# Patient Record
Sex: Female | Born: 1945 | Race: Black or African American | Hispanic: No | Smoking: Never smoker
Health system: Southern US, Community
[De-identification: ages and names within clinical notes are randomized; demographics above are authoritative.]

## PROBLEM LIST (undated history)

## (undated) DIAGNOSIS — N184 Chronic kidney disease, stage 4 (severe): Secondary | ICD-10-CM

## (undated) DIAGNOSIS — I639 Cerebral infarction, unspecified: Secondary | ICD-10-CM

## (undated) DIAGNOSIS — E119 Type 2 diabetes mellitus without complications: Secondary | ICD-10-CM

## (undated) DIAGNOSIS — I5030 Unspecified diastolic (congestive) heart failure: Secondary | ICD-10-CM

## (undated) DIAGNOSIS — I10 Essential (primary) hypertension: Secondary | ICD-10-CM

## (undated) DIAGNOSIS — J449 Chronic obstructive pulmonary disease, unspecified: Secondary | ICD-10-CM

## (undated) DIAGNOSIS — I480 Paroxysmal atrial fibrillation: Secondary | ICD-10-CM

## (undated) DIAGNOSIS — K922 Gastrointestinal hemorrhage, unspecified: Secondary | ICD-10-CM

## (undated) DIAGNOSIS — R931 Abnormal findings on diagnostic imaging of heart and coronary circulation: Secondary | ICD-10-CM

---

## 2017-11-24 DIAGNOSIS — J449 Chronic obstructive pulmonary disease, unspecified: Secondary | ICD-10-CM | POA: Diagnosis not present

## 2017-11-24 DIAGNOSIS — I5021 Acute systolic (congestive) heart failure: Secondary | ICD-10-CM

## 2017-11-24 DIAGNOSIS — I639 Cerebral infarction, unspecified: Secondary | ICD-10-CM | POA: Diagnosis not present

## 2017-11-24 DIAGNOSIS — I482 Chronic atrial fibrillation: Secondary | ICD-10-CM

## 2017-11-24 DIAGNOSIS — J9620 Acute and chronic respiratory failure, unspecified whether with hypoxia or hypercapnia: Secondary | ICD-10-CM | POA: Diagnosis not present

## 2017-11-25 DIAGNOSIS — I482 Chronic atrial fibrillation: Secondary | ICD-10-CM | POA: Diagnosis not present

## 2017-11-25 DIAGNOSIS — J9620 Acute and chronic respiratory failure, unspecified whether with hypoxia or hypercapnia: Secondary | ICD-10-CM | POA: Diagnosis not present

## 2017-11-25 DIAGNOSIS — J449 Chronic obstructive pulmonary disease, unspecified: Secondary | ICD-10-CM

## 2017-11-25 DIAGNOSIS — I639 Cerebral infarction, unspecified: Secondary | ICD-10-CM | POA: Diagnosis not present

## 2017-11-25 DIAGNOSIS — I5021 Acute systolic (congestive) heart failure: Secondary | ICD-10-CM | POA: Diagnosis not present

## 2017-11-30 ENCOUNTER — Encounter (HOSPITAL_COMMUNITY): Payer: Self-pay | Admitting: Nurse Practitioner

## 2017-11-30 ENCOUNTER — Inpatient Hospital Stay (HOSPITAL_COMMUNITY): Payer: Medicare Other

## 2017-11-30 ENCOUNTER — Inpatient Hospital Stay (HOSPITAL_COMMUNITY)
Admission: AD | Admit: 2017-11-30 | Discharge: 2017-12-20 | DRG: 004 | Disposition: A | Discharge status: Other Acute Inpatient Hospital | Payer: Medicare Other | Source: Other Acute Inpatient Hospital | Attending: Pulmonary Disease | Admitting: Pulmonary Disease

## 2017-11-30 DIAGNOSIS — J9621 Acute and chronic respiratory failure with hypoxia: Secondary | ICD-10-CM | POA: Diagnosis present

## 2017-11-30 DIAGNOSIS — R6521 Severe sepsis with septic shock: Secondary | ICD-10-CM | POA: Diagnosis present

## 2017-11-30 DIAGNOSIS — R1310 Dysphagia, unspecified: Secondary | ICD-10-CM | POA: Diagnosis present

## 2017-11-30 DIAGNOSIS — Z515 Encounter for palliative care: Secondary | ICD-10-CM

## 2017-11-30 DIAGNOSIS — J440 Chronic obstructive pulmonary disease with acute lower respiratory infection: Secondary | ICD-10-CM | POA: Diagnosis present

## 2017-11-30 DIAGNOSIS — J810 Acute pulmonary edema: Secondary | ICD-10-CM | POA: Diagnosis not present

## 2017-11-30 DIAGNOSIS — R791 Abnormal coagulation profile: Secondary | ICD-10-CM | POA: Diagnosis present

## 2017-11-30 DIAGNOSIS — N179 Acute kidney failure, unspecified: Secondary | ICD-10-CM | POA: Diagnosis present

## 2017-11-30 DIAGNOSIS — Z66 Do not resuscitate: Secondary | ICD-10-CM | POA: Diagnosis not present

## 2017-11-30 DIAGNOSIS — Z9289 Personal history of other medical treatment: Secondary | ICD-10-CM

## 2017-11-30 DIAGNOSIS — R197 Diarrhea, unspecified: Secondary | ICD-10-CM | POA: Diagnosis not present

## 2017-11-30 DIAGNOSIS — N184 Chronic kidney disease, stage 4 (severe): Secondary | ICD-10-CM | POA: Diagnosis not present

## 2017-11-30 DIAGNOSIS — J156 Pneumonia due to other aerobic Gram-negative bacteria: Secondary | ICD-10-CM | POA: Diagnosis present

## 2017-11-30 DIAGNOSIS — I5032 Chronic diastolic (congestive) heart failure: Secondary | ICD-10-CM | POA: Diagnosis present

## 2017-11-30 DIAGNOSIS — K5900 Constipation, unspecified: Secondary | ICD-10-CM | POA: Diagnosis not present

## 2017-11-30 DIAGNOSIS — I69391 Dysphagia following cerebral infarction: Secondary | ICD-10-CM

## 2017-11-30 DIAGNOSIS — I480 Paroxysmal atrial fibrillation: Secondary | ICD-10-CM | POA: Diagnosis present

## 2017-11-30 DIAGNOSIS — A4159 Other Gram-negative sepsis: Principal | ICD-10-CM | POA: Diagnosis present

## 2017-11-30 DIAGNOSIS — Y95 Nosocomial condition: Secondary | ICD-10-CM | POA: Diagnosis present

## 2017-11-30 DIAGNOSIS — G9341 Metabolic encephalopathy: Secondary | ICD-10-CM | POA: Diagnosis not present

## 2017-11-30 DIAGNOSIS — Z992 Dependence on renal dialysis: Secondary | ICD-10-CM

## 2017-11-30 DIAGNOSIS — Z930 Tracheostomy status: Secondary | ICD-10-CM

## 2017-11-30 DIAGNOSIS — D638 Anemia in other chronic diseases classified elsewhere: Secondary | ICD-10-CM | POA: Diagnosis present

## 2017-11-30 DIAGNOSIS — Z781 Physical restraint status: Secondary | ICD-10-CM

## 2017-11-30 DIAGNOSIS — I953 Hypotension of hemodialysis: Secondary | ICD-10-CM | POA: Diagnosis not present

## 2017-11-30 DIAGNOSIS — E1122 Type 2 diabetes mellitus with diabetic chronic kidney disease: Secondary | ICD-10-CM | POA: Diagnosis present

## 2017-11-30 DIAGNOSIS — E872 Acidosis: Secondary | ICD-10-CM | POA: Diagnosis present

## 2017-11-30 DIAGNOSIS — J439 Emphysema, unspecified: Secondary | ICD-10-CM | POA: Diagnosis not present

## 2017-11-30 DIAGNOSIS — I132 Hypertensive heart and chronic kidney disease with heart failure and with stage 5 chronic kidney disease, or end stage renal disease: Secondary | ICD-10-CM | POA: Diagnosis present

## 2017-11-30 DIAGNOSIS — J9601 Acute respiratory failure with hypoxia: Secondary | ICD-10-CM

## 2017-11-30 DIAGNOSIS — F411 Generalized anxiety disorder: Secondary | ICD-10-CM

## 2017-11-30 DIAGNOSIS — I9589 Other hypotension: Secondary | ICD-10-CM | POA: Diagnosis not present

## 2017-11-30 DIAGNOSIS — J189 Pneumonia, unspecified organism: Secondary | ICD-10-CM | POA: Diagnosis not present

## 2017-11-30 DIAGNOSIS — D631 Anemia in chronic kidney disease: Secondary | ICD-10-CM | POA: Diagnosis present

## 2017-11-30 DIAGNOSIS — I69354 Hemiplegia and hemiparesis following cerebral infarction affecting left non-dominant side: Secondary | ICD-10-CM | POA: Diagnosis not present

## 2017-11-30 DIAGNOSIS — I361 Nonrheumatic tricuspid (valve) insufficiency: Secondary | ICD-10-CM | POA: Diagnosis not present

## 2017-11-30 DIAGNOSIS — N186 End stage renal disease: Secondary | ICD-10-CM

## 2017-11-30 DIAGNOSIS — E785 Hyperlipidemia, unspecified: Secondary | ICD-10-CM | POA: Diagnosis not present

## 2017-11-30 DIAGNOSIS — Z9981 Dependence on supplemental oxygen: Secondary | ICD-10-CM

## 2017-11-30 DIAGNOSIS — D6489 Other specified anemias: Secondary | ICD-10-CM | POA: Diagnosis present

## 2017-11-30 DIAGNOSIS — L8990 Pressure ulcer of unspecified site, unspecified stage: Secondary | ICD-10-CM

## 2017-11-30 DIAGNOSIS — L89152 Pressure ulcer of sacral region, stage 2: Secondary | ICD-10-CM | POA: Diagnosis not present

## 2017-11-30 DIAGNOSIS — Z9911 Dependence on respirator [ventilator] status: Secondary | ICD-10-CM

## 2017-11-30 DIAGNOSIS — J9690 Respiratory failure, unspecified, unspecified whether with hypoxia or hypercapnia: Secondary | ICD-10-CM

## 2017-11-30 DIAGNOSIS — Z87891 Personal history of nicotine dependence: Secondary | ICD-10-CM

## 2017-11-30 DIAGNOSIS — A419 Sepsis, unspecified organism: Secondary | ICD-10-CM | POA: Diagnosis present

## 2017-11-30 DIAGNOSIS — Z931 Gastrostomy status: Secondary | ICD-10-CM

## 2017-11-30 DIAGNOSIS — I21A1 Myocardial infarction type 2: Secondary | ICD-10-CM | POA: Diagnosis not present

## 2017-11-30 DIAGNOSIS — J811 Chronic pulmonary edema: Secondary | ICD-10-CM | POA: Diagnosis present

## 2017-11-30 HISTORY — DX: Chronic kidney disease, stage 4 (severe): N18.4

## 2017-11-30 HISTORY — DX: Gastrointestinal hemorrhage, unspecified: K92.2

## 2017-11-30 HISTORY — DX: Cerebral infarction, unspecified: I63.9

## 2017-11-30 HISTORY — DX: Chronic obstructive pulmonary disease, unspecified: J44.9

## 2017-11-30 HISTORY — DX: Essential (primary) hypertension: I10

## 2017-11-30 HISTORY — DX: Type 2 diabetes mellitus without complications: E11.9

## 2017-11-30 HISTORY — DX: Unspecified diastolic (congestive) heart failure: I50.30

## 2017-11-30 HISTORY — DX: Abnormal findings on diagnostic imaging of heart and coronary circulation: R93.1

## 2017-11-30 HISTORY — DX: Paroxysmal atrial fibrillation: I48.0

## 2017-11-30 LAB — POCT I-STAT 3, ART BLOOD GAS (G3+)
Acid-Base Excess: 3 mmol/L — ABNORMAL HIGH (ref 0.0–2.0)
Bicarbonate: 29.5 mmol/L — ABNORMAL HIGH (ref 20.0–28.0)
O2 SAT: 86 %
PCO2 ART: 50.6 mmHg — AB (ref 32.0–48.0)
PO2 ART: 53 mmHg — AB (ref 83.0–108.0)
Patient temperature: 97.6
TCO2: 31 mmol/L (ref 22–32)
pH, Arterial: 7.372 (ref 7.350–7.450)

## 2017-11-30 LAB — PROCALCITONIN: PROCALCITONIN: 0.7 ng/mL

## 2017-11-30 LAB — CBC
HCT: 29.2 % — ABNORMAL LOW (ref 36.0–46.0)
Hemoglobin: 8.6 g/dL — ABNORMAL LOW (ref 12.0–15.0)
MCH: 28.5 pg (ref 26.0–34.0)
MCHC: 29.5 g/dL — ABNORMAL LOW (ref 30.0–36.0)
MCV: 96.7 fL (ref 78.0–100.0)
PLATELETS: 175 10*3/uL (ref 150–400)
RBC: 3.02 MIL/uL — ABNORMAL LOW (ref 3.87–5.11)
RDW: 19 % — AB (ref 11.5–15.5)
WBC: 12.1 10*3/uL — AB (ref 4.0–10.5)

## 2017-11-30 LAB — BASIC METABOLIC PANEL
Anion gap: 8 (ref 5–15)
BUN: 54 mg/dL — AB (ref 6–20)
CALCIUM: 9 mg/dL (ref 8.9–10.3)
CO2: 31 mmol/L (ref 22–32)
CREATININE: 2.45 mg/dL — AB (ref 0.44–1.00)
Chloride: 97 mmol/L — ABNORMAL LOW (ref 101–111)
GFR calc Af Amer: 22 mL/min — ABNORMAL LOW (ref >60)
GFR, EST NON AFRICAN AMERICAN: 19 mL/min — AB (ref >60)
Glucose, Bld: 136 mg/dL — ABNORMAL HIGH (ref 65–99)
Potassium: 4.5 mmol/L (ref 3.5–5.1)
SODIUM: 136 mmol/L (ref 135–145)

## 2017-11-30 LAB — GLUCOSE, CAPILLARY: Glucose-Capillary: 109 mg/dL — ABNORMAL HIGH (ref 65–99)

## 2017-11-30 LAB — LACTIC ACID, PLASMA: Lactic Acid, Venous: 1 mmol/L (ref 0.5–1.9)

## 2017-11-30 LAB — PROTIME-INR
INR: 2.45
PROTHROMBIN TIME: 26.4 s — AB (ref 11.4–15.2)

## 2017-11-30 LAB — MAGNESIUM: Magnesium: 2.1 mg/dL (ref 1.7–2.4)

## 2017-11-30 LAB — PHOSPHORUS: PHOSPHORUS: 1.9 mg/dL — AB (ref 2.5–4.6)

## 2017-11-30 LAB — TROPONIN I: Troponin I: 0.06 ng/mL (ref <0.03)

## 2017-11-30 LAB — MRSA PCR SCREENING: MRSA BY PCR: NEGATIVE

## 2017-11-30 MED ORDER — PANTOPRAZOLE SODIUM 40 MG PO PACK
40.0000 mg | PACK | Freq: Every day | ORAL | Status: DC
  Administered 2017-12-01 – 2017-12-14 (×14): 40 mg
  Filled 2017-11-30 (×13): qty 20

## 2017-11-30 MED ORDER — MIDAZOLAM HCL 2 MG/2ML IJ SOLN
1.0000 mg | INTRAMUSCULAR | Status: DC | PRN
  Administered 2017-12-03 – 2017-12-06 (×2): 1 mg via INTRAVENOUS
  Filled 2017-11-30 (×2): qty 2

## 2017-11-30 MED ORDER — PRISMASOL BGK 4/2.5 32-4-2.5 MEQ/L IV SOLN
INTRAVENOUS | Status: DC
  Administered 2017-11-30 – 2017-12-08 (×17): via INTRAVENOUS_CENTRAL
  Filled 2017-11-30 (×26): qty 5000

## 2017-11-30 MED ORDER — FENTANYL CITRATE (PF) 100 MCG/2ML IJ SOLN: 50.0000 ug | INTRAMUSCULAR | Status: DC | PRN

## 2017-11-30 MED ORDER — FENTANYL CITRATE (PF) 100 MCG/2ML IJ SOLN: 50.0000 ug | Freq: Once | INTRAMUSCULAR | Status: DC

## 2017-11-30 MED ORDER — FENTANYL BOLUS VIA INFUSION
25.0000 ug | INTRAVENOUS | Status: DC | PRN
  Administered 2017-12-01 – 2017-12-03 (×3): 25 ug via INTRAVENOUS
  Filled 2017-11-30: qty 25

## 2017-11-30 MED ORDER — IPRATROPIUM-ALBUTEROL 0.5-2.5 (3) MG/3ML IN SOLN
3.0000 mL | Freq: Four times a day (QID) | RESPIRATORY_TRACT | Status: DC
  Administered 2017-11-30 – 2017-12-07 (×27): 3 mL via RESPIRATORY_TRACT
  Filled 2017-11-30 (×24): qty 3

## 2017-11-30 MED ORDER — ATROPINE SULFATE 1 MG/10ML IJ SOSY
PREFILLED_SYRINGE | INTRAMUSCULAR | Status: AC
  Filled 2017-11-30: qty 10

## 2017-11-30 MED ORDER — HEPARIN SODIUM (PORCINE) 1000 UNIT/ML DIALYSIS
1000.0000 [IU] | INTRAMUSCULAR | Status: DC | PRN
  Administered 2017-12-06: 2200 [IU] via INTRAVENOUS_CENTRAL
  Administered 2017-12-08: 6000 [IU] via INTRAVENOUS_CENTRAL
  Filled 2017-11-30: qty 3
  Filled 2017-11-30 (×5): qty 6

## 2017-11-30 MED ORDER — SODIUM CHLORIDE 0.9 % IV SOLN
250.0000 mL | INTRAVENOUS | Status: DC | PRN
  Administered 2017-11-30: 500 mL via INTRAVENOUS
  Administered 2017-12-03 – 2017-12-17 (×7): 250 mL via INTRAVENOUS

## 2017-11-30 MED ORDER — PRISMASOL BGK 4/2.5 32-4-2.5 MEQ/L IV SOLN
INTRAVENOUS | Status: DC
Start: 2017-11-30 — End: 2017-12-11
  Administered 2017-11-30 – 2017-12-08 (×50): via INTRAVENOUS_CENTRAL
  Filled 2017-11-30 (×84): qty 5000

## 2017-11-30 MED ORDER — PRISMASOL BGK 4/2.5 32-4-2.5 MEQ/L IV SOLN
INTRAVENOUS | Status: DC
  Administered 2017-11-30 – 2017-12-07 (×6): via INTRAVENOUS_CENTRAL
  Filled 2017-11-30 (×11): qty 5000

## 2017-11-30 MED ORDER — MIDAZOLAM HCL 2 MG/2ML IJ SOLN
1.0000 mg | INTRAMUSCULAR | Status: DC | PRN
  Administered 2017-12-03 – 2017-12-15 (×19): 1 mg via INTRAVENOUS
  Filled 2017-11-30 (×20): qty 2

## 2017-11-30 MED ORDER — FENTANYL 2500MCG IN NS 250ML (10MCG/ML) PREMIX INFUSION
25.0000 ug/h | INTRAVENOUS | Status: DC
  Administered 2017-11-30: 100 ug/h via INTRAVENOUS
  Administered 2017-12-01: 150 ug/h via INTRAVENOUS
  Administered 2017-12-02 – 2017-12-03 (×3): 200 ug/h via INTRAVENOUS
  Administered 2017-12-03 – 2017-12-04 (×2): 250 ug/h via INTRAVENOUS
  Filled 2017-11-30 (×7): qty 250

## 2017-11-30 MED ORDER — HEPARIN (PORCINE) 2000 UNITS/L FOR CRRT
INTRAVENOUS_CENTRAL | Status: DC | PRN
  Administered 2017-11-30: via INTRAVENOUS_CENTRAL
  Filled 2017-11-30 (×2): qty 1000

## 2017-11-30 MED ORDER — HEPARIN SODIUM (PORCINE) 5000 UNIT/ML IJ SOLN: 5000.0000 [IU] | Freq: Three times a day (TID) | INTRAMUSCULAR | Status: DC

## 2017-11-30 MED ORDER — ORAL CARE MOUTH RINSE
15.0000 mL | OROMUCOSAL | Status: DC
  Administered 2017-12-01 – 2017-12-06 (×53): 15 mL via OROMUCOSAL

## 2017-11-30 MED ORDER — INSULIN ASPART 100 UNIT/ML ~~LOC~~ SOLN
2.0000 [IU] | SUBCUTANEOUS | Status: DC
  Administered 2017-12-01: 4 [IU] via SUBCUTANEOUS
  Administered 2017-12-01 – 2017-12-02 (×5): 2 [IU] via SUBCUTANEOUS
  Administered 2017-12-02: 4 [IU] via SUBCUTANEOUS
  Administered 2017-12-02: 2 [IU] via SUBCUTANEOUS
  Administered 2017-12-02: 4 [IU] via SUBCUTANEOUS
  Administered 2017-12-02: 2 [IU] via SUBCUTANEOUS
  Administered 2017-12-03 (×3): 4 [IU] via SUBCUTANEOUS
  Administered 2017-12-03: 2 [IU] via SUBCUTANEOUS
  Administered 2017-12-03 – 2017-12-04 (×2): 4 [IU] via SUBCUTANEOUS
  Administered 2017-12-04: 2 [IU] via SUBCUTANEOUS
  Administered 2017-12-04: 6 [IU] via SUBCUTANEOUS
  Administered 2017-12-04 (×2): 4 [IU] via SUBCUTANEOUS
  Administered 2017-12-05 (×2): 6 [IU] via SUBCUTANEOUS
  Administered 2017-12-05: 2 [IU] via SUBCUTANEOUS
  Administered 2017-12-05 – 2017-12-06 (×4): 4 [IU] via SUBCUTANEOUS
  Administered 2017-12-06: 2 [IU] via SUBCUTANEOUS
  Administered 2017-12-06 – 2017-12-07 (×7): 4 [IU] via SUBCUTANEOUS
  Administered 2017-12-07 – 2017-12-08 (×2): 6 [IU] via SUBCUTANEOUS
  Administered 2017-12-08: 2 [IU] via SUBCUTANEOUS
  Administered 2017-12-08: 4 [IU] via SUBCUTANEOUS
  Administered 2017-12-08: 6 [IU] via SUBCUTANEOUS
  Administered 2017-12-08 (×2): 4 [IU] via SUBCUTANEOUS
  Administered 2017-12-09 – 2017-12-10 (×9): 6 [IU] via SUBCUTANEOUS

## 2017-11-30 MED ORDER — CHLORHEXIDINE GLUCONATE 0.12% ORAL RINSE (MEDLINE KIT)
15.0000 mL | Freq: Two times a day (BID) | OROMUCOSAL | Status: DC
  Administered 2017-11-30 – 2017-12-05 (×11): 15 mL via OROMUCOSAL

## 2017-11-30 MED ORDER — NOREPINEPHRINE 4 MG/250ML-% IV SOLN
0.0000 ug/min | INTRAVENOUS | Status: DC
  Administered 2017-11-30: 9 ug/min via INTRAVENOUS
  Administered 2017-12-01: 25 ug/min via INTRAVENOUS
  Administered 2017-12-01: 18 ug/min via INTRAVENOUS
  Administered 2017-12-01: 25 ug/min via INTRAVENOUS
  Filled 2017-11-30 (×4): qty 250

## 2017-11-30 NOTE — H&P (Addendum)
PULMONARY / CRITICAL CARE MEDICINE   Name: Sheila Cabrera MRN: [id number] DOB: [birth year]    ADMISSION DATE:  11/30/2017 CONSULTATION DATE:  11/30/2017  [facility]ING MD:  [facility]   CHIEF COMPLAINT:  Respiratory Distress   HISTORY OF PRESENT ILLNESS:   72 year old female with PMH of DM, COPD, Diastolic HF, ESRD, CVA, Dysphagia s/p PEG  [facility] 5/6 > Presented with hypoxia/AMS secondary to PNA requiring intubation, transferred to [facility] 5/6. Due to GI bleed Eliquis was held, during this time developed acute bilateral cortical strokes. Previously had Stage 4 CKD however this progressed to ESRD with needs for HD. Extubated (unknown time frame), however due to inability to wean off BiPAP patient was transferred to [facility] on 5/31.    While at [facility] was intermittently off BiPAP for short periods. On 6/7 required intubation for respiratory distress. Due to inability to tolerate HD with bradycardia and hypotension patient was transferred to [facility].   PAST MEDICAL HISTORY :  She  has a past medical history of CHF (congestive heart failure) (HCC), Chronic kidney disease, COPD (chronic obstructive pulmonary disease) (HCC), Diabetes mellitus without complication (HCC), Hypertension, and Stroke (HCC).  PAST SURGICAL HISTORY: She  has no past surgical history on file.  Allergies not on file  No current facility-administered medications on file prior to encounter.    No current outpatient medications on file prior to encounter.    FAMILY HISTORY:  Her has no family status information on file.    SOCIAL HISTORY: She    REVIEW OF SYSTEMS:   Unable to review as patient is intubated and sedated   SUBJECTIVE:   VITAL SIGNS: BP (!) 159/68   Pulse 74   Temp 97.6 F (36.4 C) (Oral)   Resp (!) 26   Wt 87.6 kg (193 lb 2 oz)   SpO2 91%   HEMODYNAMICS:    VENTILATOR SETTINGS: Vent Mode: PRVC FiO2 (%):  [60 %-80 %] 80 % Set Rate:  [26 bmp] 26 bmp Vt Set:  [500 mL]  500 mL PEEP:  [10 cmH20] 10 cmH20 Plateau Pressure:  [26 cmH20] 26 cmH20  INTAKE / OUTPUT: No intake/output data recorded.  PHYSICAL EXAMINATION: General:  Chronically ill elderly female, on vent  Neuro:  Sedated, opens eyes to physical stimulation, +gag/cough  HEENT:  ETT in place  Cardiovascular:  Irregular, no MRG  Lungs:  Crackles to bases, diminished breath sounds  Abdomen:  Active bowel sounds, peg in place  Musculoskeletal:  +2 BLE  Skin:  Warm, dry, stage 2 sacral wound   LABS:  BMET Recent Labs  Lab 11/30/17 2126  NA 136  K 4.5  CL 97*  CO2 31  BUN 54*  CREATININE 2.45*  GLUCOSE 136*    Electrolytes Recent Labs  Lab 11/30/17 2126  CALCIUM 9.0  MG 2.1  PHOS 1.9*    CBC Recent Labs  Lab 11/30/17 2126  WBC 12.1*  HGB 8.6*  HCT 29.2*  PLT 175    Coag's Recent Labs  Lab 11/30/17 2126  INR 2.45    Sepsis Markers Recent Labs  Lab 11/30/17 2126 11/30/17 2130  LATICACIDVEN  --  1.0  PROCALCITON 0.70  --     ABG Recent Labs  Lab 11/30/17 2226  PHART 7.372  PCO2ART 50.6*  PO2ART 53.0*    Liver Enzymes No results for input(s): AST, ALT, ALKPHOS, BILITOT, ALBUMIN in the last 168 hours.  Cardiac Enzymes Recent Labs  Lab 11/30/17 2126  TROPONINI 0.06*  Glucose Recent Labs  Lab 11/30/17 2055  GLUCAP 109*    Imaging Dg Chest Port 1 View  Result Date: 11/30/2017 CLINICAL DATA:  Follow-up aeration on ventilation-dependent patient. EXAM: PORTABLE CHEST 1 VIEW COMPARISON:  None. FINDINGS: The patient's endotracheal tube is seen ending 8 cm above the carina. A left IJ line is noted ending about the mid SVC. A right-sided dual-lumen catheter is noted ending about the distal SVC. Small bilateral pleural effusions are noted. Hazy bibasilar airspace opacification may reflect pulmonary edema or possibly pneumonia. No pneumothorax is seen. The cardiomediastinal silhouette is normal in size. No acute osseous abnormalities are identified.  IMPRESSION: 1. Endotracheal tube seen ending 8 cm above the carina. 2. Left IJ line noted ending about the mid SVC. 3. Small bilateral pleural effusions noted. Hazy bibasilar airspace opacification may reflect pulmonary edema or possibly pneumonia. Electronically Signed   By: Roanna RaiderJeffery  Chang M.D.   On: 11/30/2017 21:19     STUDIES:  CXR 6/7 > The patient's endotracheal tube is seen ending 8 cm above the carina. A left IJ line is noted ending about the mid SVC. A right-sided dual-lumen catheter is noted ending about the distal SVC. Small bilateral pleural effusions are noted. Hazy bibasilar airspace opacification may reflect pulmonary edema or possibly pneumonia. No pneumothorax is seen. The cardiomediastinal silhouette is normal in size. No acute osseous abnormalities are identified.  CULTURES: Blood 6/7 >> Sputum 6/7 >>  ANTIBIOTICS: None.   SIGNIFICANT EVENTS: 6/7 > [facility]   LINES/TUBES: CVC Left IJ 6/7 (OSH) >>  ETT 6/6 (OSH) >> Right IJ Tunneled HD >>  PEG >>    DISCUSSION: 72 year old female with prolonged hospitalization secondary to respiratory distress and ESRD, re-intubated on 6/7 and [facility]   ASSESSMENT / PLAN:  PULMONARY A: Acute on Chronic Hypoxic Respiratory Distress in setting of decompensated heart failure with pulmonary edema vs PNA  H/O COPD, 30 year smoking history  P:   Vent Support > Advance ETT 5 cm  Trend ABG/CXR Pulmonary Hygiene VAP Bundle   CARDIOVASCULAR A:  Hypotension in setting of CardioRenal Syndrome vs Sepsis  H/O HTN, CHF, A.Fib (On Coumadin)  P:  Cardiac Monitoring  Wean Levophed to maintain systolic >90/MAP >04>65  Trend Troponin  Heparin gtt (in place of coumadin)  ECHO pending   RENAL A:   ESRD on HD MWF  -Not tolerating HD due to bradycardia/hypotension  -LA 1.0  P:   Nephrology Consulted > Plans for CRRT  Trend BMP  Replace electrolytes as indicated   GASTROINTESTINAL A:    SUP  Dysphagia s/p PEG  P:   NPO PPI  Prealbumin in AM   HEMATOLOGIC A:   Anemia of Chronic Disease  P:  Trend CBC  Heparin as above  Trend INR/PT  INFECTIOUS A:   Recent CAP  P:   Trend WBC and Fever Curve Follow Culture Data  Trend PCT  ENDOCRINE A:   H/O DM    P:   Trend Glucose  SSI   NEUROLOGIC A:   Sedation Needs  CVA with left sided hemiplegia H/O Depression/Anxiety   P:   RASS goal: 0/-1  Wean Fentanyl gtt to achieve RASS PRN Versed    FAMILY  - Updates: Son -Louanna RawJustin President HCPOA - updated via phone   - Inter-disciplinary family meet or Palliative Care meeting due by: 12/07/2017  CC Time: 55 minutes   Jovita KussmaulKatalina Oval Cavazos, AGACNP-BC [facility]  Pgr: [phone]  PCCM Pgr: [phone]

## 2017-11-30 NOTE — Consult Note (Signed)
Reason for Consult: Volume overload in patient with ESRD-pressor dependent hypotension Referring Physician: Marcelle SmilingSeong-Joo Jeong M.D.  HPI:  72 year old African-American woman with past history of hypertension, type 2 diabetes mellitus, congestive heart failure, COPD on chronic oxygen supplementation, atrial fibrillation and recent failure progression to end-stage renal disease from chronic kidney disease stage IV after suffering sepsis/AKI.  [facility] for increasing volume overload/acute respiratory failure that could not be treated with intermittent hemodialysis because of hypotension requiring initiation of pressors.  Dr. Petra KubaKilpatrick [facility] and Dr. Hyman HopesWebb from nephrology had a lengthy discussion with the family who want all measures taken at this time in her care./Ultrafiltration   Past medical history: End-stage renal disease (status post TDC on 11/12/2017) Hypertension Type 2 diabetes mellitus Congestive heart failure Oxygen dependent chronic obstructive lung disease Atrial fibrillation (anticoagulation with Eliquis recently discontinued because of GI bleed) Recent bilateral cortical CVA (status post PEG tube 5/22)  Family and social history Unable to obtain  Allergies: Allergies not on file  Medications:  Scheduled: . insulin aspart  2-6 Units Subcutaneous Q4H  . ipratropium-albuterol  3 mL Nebulization Q6H  . [START ON 12/01/2017] pantoprazole sodium  40 mg Per Tube Daily    Results for orders placed or performed during the hospital encounter of 11/30/17 (from the past 48 hour(s))  Glucose, capillary     Status: Abnormal   Collection Time: 11/30/17  8:55 PM  Result Value Ref Range   Glucose-Capillary 109 (H) 65 - 99 mg/dL    Dg Chest Port 1 View  Result Date: 11/30/2017 CLINICAL DATA:  Follow-up aeration on ventilation-dependent patient. EXAM: PORTABLE CHEST 1 VIEW COMPARISON:  None. FINDINGS: The patient's  endotracheal tube is seen ending 8 cm above the carina. A left IJ line is noted ending about the mid SVC. A right-sided dual-lumen catheter is noted ending about the distal SVC. Small bilateral pleural effusions are noted. Hazy bibasilar airspace opacification may reflect pulmonary edema or possibly pneumonia. No pneumothorax is seen. The cardiomediastinal silhouette is normal in size. No acute osseous abnormalities are identified. IMPRESSION: 1. Endotracheal tube seen ending 8 cm above the carina. 2. Left IJ line noted ending about the mid SVC. 3. Small bilateral pleural effusions noted. Hazy bibasilar airspace opacification may reflect pulmonary edema or possibly pneumonia. Electronically Signed   By: Roanna RaiderJeffery  Chang M.D.   On: 11/30/2017 21:19    Review of Systems  Unable to perform ROS: Intubated   Temperature 97.6 F (36.4 C), temperature source Oral, SpO2 95 %. Physical Exam  Nursing note and vitals reviewed. Constitutional: She appears well-developed and well-nourished. No distress.  HENT:  Head: Normocephalic and atraumatic.  Mouth/Throat: Oropharynx is clear and moist.  Intubated, sedated  Eyes: Pupils are equal, round, and reactive to light. Conjunctivae are normal.  Neck: Normal range of motion. JVD present. No thyromegaly present.  Cardiovascular: Normal rate and intact distal pulses.  No murmur heard. Respiratory: Effort normal. She has rales.  Fine rales over bases  GI: Soft. Bowel sounds are normal. There is no tenderness. There is no rebound.  PEG tube left upper quadrant  Musculoskeletal: Normal range of motion. She exhibits edema.  2+ edema over lower extremities/dependent areas  Skin: Skin is warm and dry. No rash noted. She is not diaphoretic.    Assessment/Plan: 1.  Volume overload/pulmonary edema/ventilator dependent respiratory failure: Unfortunately, unable to successfully undergo ultrafiltration with hemodialysis and for which CRRT will be started tonight for  efforts at aggressive volume unloading/ultrafiltration.   2.  End-stage renal disease: Unable to tolerate ultrafiltration with hemodialysis and will be switched transiently to CRRT.  The bigger question is as to whether she would be able to tolerate conventional hemodialysis from here on out or whether repeated efforts at ultrafiltration will be limited by her hypotension.  If the latter ensues, the course will be towards palliative care. 3.  Anemia of chronic kidney disease: Status post PRBC on 11/26/2017 and started on ESA prior to her transfer here.  We will continue to follow hemoglobin/hematocrit trend to decide on need for iron versus ESA. 4.  Hypotension/possible sepsis: Awaiting labs to decide on need for expanding search for source of infection/need for antimicrobial therapy. 5.  History of atrial fibrillation 6.  History of recent CVA with left-sided hemiplegia  Kanav Kazmierczak K. 11/30/2017, 9:39 PM

## 2017-11-30 NOTE — Progress Notes (Signed)
ANTICOAGULATION CONSULT NOTE - Initial Consult  Pharmacy Consult for Heparin when INR <2 Indication: atrial fibrillation and stroke  Allergies not on file  Patient Measurements:  Weight 200 lbs (90.9 kg)  Height pending  Heparin dosing weight: pending  Vital Signs: Temp: 97.6 F (36.4 C) (06/07 2056) Temp Source: Oral (06/07 2056) BP: 138/62 (06/07 2200) Pulse Rate: 68 (06/07 2200)  Labs: Recent Labs    11/30/17 2126  HGB 8.6*  HCT 29.2*  PLT 175  LABPROT 26.4*  INR 2.45  CREATININE 2.45*  TROPONINI 0.06*   Assessment:   72 yr old female [facility].  Has been hospitalized since 10/29/17. [place] then [facility];  transferred to [facility] on 11/23/17    Pharmacy to begin IV heparin for atrial fibrillation when INR <2.    Previously anticoagulated with Eliquis, which was held at some point due to concern for GI bleed; bilateral CVA two days later. Transitioned to Coumadin; unsure of timing, but INR 2.79 this am at [facility] and noted on Coumadin 6 mg daily.  [facility].  Expect last Coumadin dose was given 6/6.  Black tarry stool today per [facility] notes; has been on IV iron.  Goal of Therapy:  Heparin level 0.3-0.5 units/ml (lower end of therapeutic range with recent CVA) Monitor platelets by anticoagulation protocol: Yes   Plan:   Begin IV heparin when INR <2.  Daily PT/INR.  Coumadin on hold.  Dennie FettersEgan, Cuong Moorman Donovan, RPh Pager: [phone] 11/30/2017,10:28 PM

## 2017-11-30 NOTE — Progress Notes (Signed)
CRITICAL VALUE ALERT  Critical Value:  Trop-0.06  Date & Time Notied:  11/30/2017 10:21 PM  Provider Notified: Jovita KussmaulKatalina Eubanks, NP  Orders Received/Actions taken: none

## 2017-12-01 ENCOUNTER — Inpatient Hospital Stay (HOSPITAL_COMMUNITY): Payer: Medicare Other

## 2017-12-01 ENCOUNTER — Encounter (HOSPITAL_COMMUNITY): Payer: Self-pay | Admitting: Nurse Practitioner

## 2017-12-01 ENCOUNTER — Other Ambulatory Visit (HOSPITAL_COMMUNITY): Payer: Self-pay

## 2017-12-01 DIAGNOSIS — D638 Anemia in other chronic diseases classified elsewhere: Secondary | ICD-10-CM

## 2017-12-01 DIAGNOSIS — N184 Chronic kidney disease, stage 4 (severe): Secondary | ICD-10-CM

## 2017-12-01 DIAGNOSIS — I361 Nonrheumatic tricuspid (valve) insufficiency: Secondary | ICD-10-CM

## 2017-12-01 DIAGNOSIS — N179 Acute kidney failure, unspecified: Secondary | ICD-10-CM

## 2017-12-01 DIAGNOSIS — J439 Emphysema, unspecified: Secondary | ICD-10-CM

## 2017-12-01 DIAGNOSIS — J9621 Acute and chronic respiratory failure with hypoxia: Secondary | ICD-10-CM

## 2017-12-01 DIAGNOSIS — J810 Acute pulmonary edema: Secondary | ICD-10-CM

## 2017-12-01 DIAGNOSIS — L89152 Pressure ulcer of sacral region, stage 2: Secondary | ICD-10-CM

## 2017-12-01 LAB — RENAL FUNCTION PANEL
Albumin: 2.9 g/dL — ABNORMAL LOW (ref 3.5–5.0)
Albumin: 3 g/dL — ABNORMAL LOW (ref 3.5–5.0)
Anion gap: 9 (ref 5–15)
Anion gap: 9 (ref 5–15)
BUN: 47 mg/dL — ABNORMAL HIGH (ref 6–20)
BUN: 30 mg/dL — AB (ref 6–20)
CALCIUM: 8.7 mg/dL — AB (ref 8.9–10.3)
CHLORIDE: 97 mmol/L — AB (ref 101–111)
CO2: 30 mmol/L (ref 22–32)
CO2: 29 mmol/L (ref 22–32)
CREATININE: 2.09 mg/dL — AB (ref 0.44–1.00)
CREATININE: 1.46 mg/dL — AB (ref 0.44–1.00)
Calcium: 9.1 mg/dL (ref 8.9–10.3)
Chloride: 98 mmol/L — ABNORMAL LOW (ref 101–111)
GFR calc Af Amer: 26 mL/min — ABNORMAL LOW (ref >60)
GFR calc non Af Amer: 23 mL/min — ABNORMAL LOW (ref >60)
GFR calc non Af Amer: 35 mL/min — ABNORMAL LOW (ref >60)
GFR, EST AFRICAN AMERICAN: 41 mL/min — AB (ref >60)
Glucose, Bld: 114 mg/dL — ABNORMAL HIGH (ref 65–99)
Glucose, Bld: 135 mg/dL — ABNORMAL HIGH (ref 65–99)
Phosphorus: 1.9 mg/dL — ABNORMAL LOW (ref 2.5–4.6)
Phosphorus: 1.8 mg/dL — ABNORMAL LOW (ref 2.5–4.6)
Potassium: 4.6 mmol/L (ref 3.5–5.1)
Potassium: 4.5 mmol/L (ref 3.5–5.1)
SODIUM: 136 mmol/L (ref 135–145)
Sodium: 136 mmol/L (ref 135–145)

## 2017-12-01 LAB — ECHOCARDIOGRAM COMPLETE
Height: 66 in
WEIGHTICAEL: 3086.44 [oz_av]

## 2017-12-01 LAB — BLOOD GAS, ARTERIAL
ACID-BASE EXCESS: 5.1 mmol/L — AB (ref 0.0–2.0)
Bicarbonate: 29 mmol/L — ABNORMAL HIGH (ref 20.0–28.0)
Drawn by: 245131
FIO2: 50
MECHVT: 500 mL
O2 Saturation: 94.6 %
PCO2 ART: 41.3 mmHg (ref 32.0–48.0)
PEEP/CPAP: 10 cmH2O
PO2 ART: 62.1 mmHg — AB (ref 83.0–108.0)
Patient temperature: 98.6
RATE: 26 resp/min
pH, Arterial: 7.46 — ABNORMAL HIGH (ref 7.350–7.450)

## 2017-12-01 LAB — GLUCOSE, CAPILLARY
GLUCOSE-CAPILLARY: 111 mg/dL — AB (ref 65–99)
GLUCOSE-CAPILLARY: 133 mg/dL — AB (ref 65–99)
GLUCOSE-CAPILLARY: 124 mg/dL — AB (ref 65–99)
Glucose-Capillary: 120 mg/dL — ABNORMAL HIGH (ref 65–99)
Glucose-Capillary: 123 mg/dL — ABNORMAL HIGH (ref 65–99)
Glucose-Capillary: 131 mg/dL — ABNORMAL HIGH (ref 65–99)
Glucose-Capillary: 156 mg/dL — ABNORMAL HIGH (ref 65–99)

## 2017-12-01 LAB — CBC
HEMATOCRIT: 29.2 % — AB (ref 36.0–46.0)
Hemoglobin: 8.5 g/dL — ABNORMAL LOW (ref 12.0–15.0)
MCH: 28.1 pg (ref 26.0–34.0)
MCHC: 29.1 g/dL — ABNORMAL LOW (ref 30.0–36.0)
MCV: 96.4 fL (ref 78.0–100.0)
PLATELETS: 178 10*3/uL (ref 150–400)
RBC: 3.03 MIL/uL — ABNORMAL LOW (ref 3.87–5.11)
RDW: 18.8 % — AB (ref 11.5–15.5)
WBC: 12.3 10*3/uL — AB (ref 4.0–10.5)

## 2017-12-01 LAB — COOXEMETRY PANEL
CARBOXYHEMOGLOBIN: 1.5 % (ref 0.5–1.5)
Carboxyhemoglobin: 1.2 % (ref 0.5–1.5)
Methemoglobin: 1.4 % (ref 0.0–1.5)
Methemoglobin: 1.6 % — ABNORMAL HIGH (ref 0.0–1.5)
O2 SAT: 64.9 %
O2 Saturation: 43.3 %
TOTAL HEMOGLOBIN: 12.2 g/dL (ref 12.0–16.0)
TOTAL HEMOGLOBIN: 8.9 g/dL — AB (ref 12.0–16.0)

## 2017-12-01 LAB — PROCALCITONIN: PROCALCITONIN: 0.59 ng/mL

## 2017-12-01 LAB — TROPONIN I
Troponin I: 0.05 ng/mL (ref <0.03)
Troponin I: 0.05 ng/mL (ref <0.03)

## 2017-12-01 LAB — PREALBUMIN: Prealbumin: 20 mg/dL (ref 18–38)

## 2017-12-01 LAB — HIV ANTIBODY (ROUTINE TESTING W REFLEX): HIV Screen 4th Generation wRfx: NONREACTIVE

## 2017-12-01 LAB — PROTIME-INR
INR: 2.4
PROTHROMBIN TIME: 26 s — AB (ref 11.4–15.2)

## 2017-12-01 MED ORDER — VANCOMYCIN HCL 10 G IV SOLR
1750.0000 mg | Freq: Once | INTRAVENOUS | Status: AC
  Administered 2017-12-01: 1750 mg via INTRAVENOUS
  Filled 2017-12-01: qty 1750

## 2017-12-01 MED ORDER — NOREPINEPHRINE 16 MG/250ML-% IV SOLN
0.0000 ug/min | INTRAVENOUS | Status: DC
  Administered 2017-12-01: 25 ug/min via INTRAVENOUS
  Administered 2017-12-02: 12 ug/min via INTRAVENOUS
  Administered 2017-12-03: 20 ug/min via INTRAVENOUS
  Administered 2017-12-03: 25 ug/min via INTRAVENOUS
  Administered 2017-12-04: 28 ug/min via INTRAVENOUS
  Administered 2017-12-04: 20 ug/min via INTRAVENOUS
  Administered 2017-12-07: 6 ug/min via INTRAVENOUS
  Administered 2017-12-09: 12 ug/min via INTRAVENOUS
  Administered 2017-12-10: 16 ug/min via INTRAVENOUS
  Administered 2017-12-10: 10 ug/min via INTRAVENOUS
  Filled 2017-12-01 (×11): qty 250

## 2017-12-01 MED ORDER — VANCOMYCIN HCL IN DEXTROSE 1-5 GM/200ML-% IV SOLN
1000.0000 mg | INTRAVENOUS | Status: DC
Start: 2017-12-02 — End: 2017-12-04
  Administered 2017-12-02 – 2017-12-03 (×2): 1000 mg via INTRAVENOUS
  Filled 2017-12-01 (×2): qty 200

## 2017-12-01 MED ORDER — DOBUTAMINE IN D5W 4-5 MG/ML-% IV SOLN
5.0000 ug/kg/min | INTRAVENOUS | Status: DC
  Administered 2017-12-01: 5 ug/kg/min via INTRAVENOUS
  Filled 2017-12-01: qty 250

## 2017-12-01 MED ORDER — VITAL HIGH PROTEIN PO LIQD: 1000.0000 mL | ORAL | Status: DC

## 2017-12-01 MED ORDER — PRO-STAT SUGAR FREE PO LIQD
60.0000 mL | Freq: Two times a day (BID) | ORAL | Status: DC
  Administered 2017-12-01 – 2017-12-05 (×9): 60 mL
  Filled 2017-12-01 (×10): qty 60

## 2017-12-01 MED ORDER — CHLORHEXIDINE GLUCONATE CLOTH 2 % EX PADS
6.0000 | MEDICATED_PAD | Freq: Every day | CUTANEOUS | Status: DC
  Administered 2017-12-01 – 2017-12-19 (×18): 6 via TOPICAL

## 2017-12-01 MED ORDER — PIPERACILLIN-TAZOBACTAM 3.375 G IVPB: 3.3750 g | Freq: Three times a day (TID) | INTRAVENOUS | Status: DC

## 2017-12-01 MED ORDER — DARBEPOETIN ALFA 60 MCG/0.3ML IJ SOSY
60.0000 ug | PREFILLED_SYRINGE | INTRAMUSCULAR | Status: DC
  Administered 2017-12-01 – 2017-12-08 (×2): 60 ug via SUBCUTANEOUS
  Filled 2017-12-01 (×3): qty 0.3

## 2017-12-01 MED ORDER — PIPERACILLIN-TAZOBACTAM 3.375 G IVPB 30 MIN
3.3750 g | Freq: Once | INTRAVENOUS | Status: AC
Start: 2017-12-01 — End: 2017-12-01
  Administered 2017-12-01: 3.375 g via INTRAVENOUS
  Filled 2017-12-01: qty 50

## 2017-12-01 MED ORDER — VITAL AF 1.2 CAL PO LIQD
1000.0000 mL | ORAL | Status: DC
  Administered 2017-12-01 – 2017-12-04 (×3): 1000 mL
  Filled 2017-12-01 (×7): qty 1000

## 2017-12-01 MED ORDER — PIPERACILLIN-TAZOBACTAM 3.375 G IVPB 30 MIN
3.3750 g | Freq: Three times a day (TID) | INTRAVENOUS | Status: DC
  Administered 2017-12-01 – 2017-12-04 (×8): 3.375 g via INTRAVENOUS
  Filled 2017-12-01 (×9): qty 50

## 2017-12-01 NOTE — Progress Notes (Signed)
  Echocardiogram 2D Echocardiogram has been performed.  Sheila Cabrera, Sheila Cabrera 12/01/2017, 2:36 PM

## 2017-12-01 NOTE — Consult Note (Addendum)
Cardiology Consult    Patient ID: Sheila Cabrera MRN: [id number], DOB/AGE: April 02, 1946   Admit date: 11/30/2017 Date of Consult: 12/01/2017  Primary Physician: No primary care provider on file. Primary Cardiologist: No primary care provider on file. - prev followed in [place] Requesting Provider: Franco Collet, MD  Patient Profile    Sheila Cabrera is a 72 y.o. female with a history of HFpEF, CKD IV  ESRD since 10/2017, COPD and resp failure s/p PNA 10/2017, HTN, DMII, GIB 10/2017, PAF, and stroke, who is being seen today for the evaluation of CHF/hypotension at the request of Dr. Lamonte Sakai.  Past Medical History   Past Medical History:  Diagnosis Date  . (HFpEF) heart failure with preserved ejection fraction ([place])    a. 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  . CKD (chronic kidney disease), stage IV ([place])    a. 10/2017 advanced to ESRD-->HD.  Marland Kitchen COPD (chronic obstructive pulmonary disease) ([place])    a. On home O2.  . 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 10/2017 CTA chest: mild coronary Ca2+.  . Diabetes mellitus without complication ([place])   . Essential hypertension   . GIB (gastrointestinal bleeding)    a. 10/2017 in setting of Eliquis Rx.  Marland Kitchen PAF (paroxysmal atrial fibrillation) ([place])    a. Prev on eliquis (CHA2DS2VASc = 7)-->stopped 10/2017 in setting of GIB.  . Stroke ([place])    a. 10/2017 MRI: acute cortical infarct in bilat parietal lobes, bilat post paramedian parietal lobes, and R occipital lobe. Small focal acute infarct in white matter of R cerebellar hemisphere.     Surgical history incomplete and   Allergies  Allergies not on file  History of Present Illness    Sheila Cabrera is a 72 y.o. female with a history of HFpEF, CKD IV  ESRD since 10/2017, COPD and resp failure s/p PNA 10/2017, HTN, DMII, GIB 10/2017, PAF, and  stroke.  Pt is currently intubated and sedated and is not able to participate in interview.  She was admitted to [facility] in early May with encephalopathy and resp failure req intubation, and was subsequently tx to [facility] in [place] on 10/29/2017.  There, she was treated for sepsis, ongoing resp failure, GIB (eliquis d/c'd), and subsequently stroke, w/ MRI showing acute cortical infarct in bilat parietal lobes, bilat post paramedian parietal lobes, and R occipital lobe, and a small focal acute infarct in white matter of R cerebellar hemisphere. Echo showed nl LV fxn w/ ? Small pulm AVF. Due to ongoing renal failure, she required initiation of HD.  She underwent tunneled catheter placement on 5/20, and PEG placement on 5/22. At some point, she was placed on coumadin.  She was able to be extubated and transitioned to BiPap but was not able to be weaned from BiPap, and was thus tx to [facility] @ hosp d/c.  Unfortunately, she acutely decompensated on 6/6 and required intubation and vasopressor therapy.  Decision was made to seek admission to [facility] on 6/7 for CRRT and further mgmt of hypotension and resp failure.  Here, she has been managed by critical care.  She remains intubated, sedated, and hypotensive.  CXR last night with pulm edema.  Earlier this AM, a co-ox was sent off and returned @ 43.3. CVP was 8 @ the time.  As a result,  she was placed on dobutamine and we've been asked to eval.  Inpatient Medications    . chlorhexidine gluconate (MEDLINE KIT)  15 mL Mouth Rinse BID  . Chlorhexidine Gluconate Cloth  6 each Topical Daily  . darbepoetin (ARANESP) injection - NON-DIALYSIS  60 mcg Subcutaneous Q Sat-1800  . feeding supplement (PRO-STAT SUGAR FREE 64)  60 mL Per Tube BID BM  . fentaNYL (SUBLIMAZE) injection  50 mcg Intravenous Once  . insulin aspart  2-6 Units Subcutaneous Q4H  . ipratropium-albuterol  3 mL Nebulization Q6H  . mouth rinse  15 mL Mouth Rinse 10 times per day  .  pantoprazole sodium  40 mg Per Tube Daily    Family History    Records reviewed.  No pertinent FH available.  Pt intubated and sedated and is not able to provide anymore detail related to her FH.  Social History    Records reviewed.  No pertinent SH available.  Pt intubated and sedated and is not able to provide anymore detail related to her SH.  Review of Systems   Pt intubated and sedated and is not able to provide anymore detail related to her ROS  Physical Exam    Blood pressure (!) 73/39, pulse (!) 106, temperature (!) 97.5 F (36.4 C), temperature source Axillary, resp. rate (!) 26, height _0  (1.676 m), weight 192 lb 14.4 oz (87.5 kg), SpO2 94 %.  General: Intubated, sedated. Psych: Sedated. Neuro: Sedated.  Does not follow commands or move extremities. HEENT: Normal  Neck: Supple without bruits.  JVP ~ 10cm. Lungs:  Resp regular and unlabored, diminished breath sounds bilat. Heart: RRR no s3, s4, or murmurs. Abdomen: Soft, non-tender, non-distended, BS + x 4.  Extremities: Warm. No clubbing, cyanosis or edema. DP/PT/Radials 2+ and equal bilaterally.  Labs     Recent Labs    11/30/17 2126 12/01/17 0401  TROPONINI 0.06* 0.05*   Lab Results  Component Value Date   WBC 12.3 (H) 12/01/2017   HGB 8.5 (L) 12/01/2017   HCT 29.2 (L) 12/01/2017   MCV 96.4 12/01/2017   PLT 178 12/01/2017    Recent Labs  Lab 12/01/17 0401  NA 136  K 4.6  CL 97*  CO2 30  BUN 47*  CREATININE 2.09*  CALCIUM 9.1  GLUCOSE 114*    Lab Results  Component Value Date   INR 2.40 12/01/2017   INR 2.45 11/30/2017    Radiology Studies    Dg Chest Port 1 View  Result Date: 11/30/2017 CLINICAL DATA:  Endotracheal tube repositioning. EXAM: PORTABLE CHEST 1 VIEW COMPARISON:  Chest radiograph performed earlier today at 8:59 p.m. FINDINGS: The patient's endotracheal tube is seen ending 5 cm above the carina. A right-sided dual-lumen catheter is noted ending about the distal SVC. A left IJ  line is noted ending about the mid SVC. There is mildly worsened vascular congestion. Bibasilar airspace opacities raise concern for mildly worsening pulmonary edema. Small bilateral pleural effusions are noted. No pneumothorax is seen. The cardiomediastinal silhouette is normal in size. No acute osseous abnormalities are identified. IMPRESSION: 1. Endotracheal tube seen ending 5 cm above the carina. 2. Mildly worsened vascular congestion noted. Bibasilar airspace opacities raise concern for mildly worsening pulmonary edema. Small bilateral pleural effusions noted. Electronically Signed   By: Garald Balding M.D.   On: 11/30/2017 23:42   Dg Chest Port 1 View  Result Date: 11/30/2017 CLINICAL DATA:  Follow-up aeration on ventilation-dependent patient. EXAM: PORTABLE CHEST 1 VIEW COMPARISON:  None.  FINDINGS: The patient's endotracheal tube is seen ending 8 cm above the carina. A left IJ line is noted ending about the mid SVC. A right-sided dual-lumen catheter is noted ending about the distal SVC. Small bilateral pleural effusions are noted. Hazy bibasilar airspace opacification may reflect pulmonary edema or possibly pneumonia. No pneumothorax is seen. The cardiomediastinal silhouette is normal in size. No acute osseous abnormalities are identified. IMPRESSION: 1. Endotracheal tube seen ending 8 cm above the carina. 2. Left IJ line noted ending about the mid SVC. 3. Small bilateral pleural effusions noted. Hazy bibasilar airspace opacification may reflect pulmonary edema or possibly pneumonia. Electronically Signed   By: Garald Balding M.D.   On: 11/30/2017 21:19    ECG & Cardiac Imaging    12/01/2017: Sinus tachycardia, 105, ant TWI.  Tele: sinus rhythm/sinus tachy with intermittent bradycardia - during suctioning.  ECG 5/6 @ [facility]  Systolic BP 675 mmHg  Diastolic BP 60 mmHg  Ventricular Rate 78 BPM  Atrial Rate 78 BPM  P-R Interval 166 ms  QRS Duration 90 ms  Q-T Interval 420 ms  QTC  Calculation(Bezet) 478 ms  P Axis 84 degrees  R Axis 80 degrees  T Axis 67 degrees  Diagnosis Line Normal sinus rhythmNonspecific T wave abnormalityCannot rule out Anterior infarct , age undeterminedAbnormal ECGNo previous ECGs available   _____________  2D Echocardiogram 11/01/2017  Summary  1. Overall left ventricular ejection fraction is estimated at 60 to 65%.  2. Mildly enlarged right ventricle.  3. Mildly increased RV wall thickness.  4. Mildly reduced RV systolic function.  5. Mildly dilated right atrium.  6. There is late transit of saline microbubbles into the LA 7-9 beats after maximal opaciifcation of the RA suggesting possible small pulmonary AV fistula rather than a PFO. _____________   Repeat echo pending   Assessment & Plan    1.  Acute on chronic hypoxic respiratory failure/?Cardiogenic shock:  Pt w/ prolonged admission throughout much of the month of May in the setting of PNA and sepsis, complicated by ESRD req HD, GIB, and stroke.  Echo during that admission showed nl EF.  She was d/c'd to [facility] on BiPap after prolonged intubation but required reintubation on 6/6 in the setting of recurrent resp failure and hypotension.  Vent mgmt per CCM.  Co-ox this AM only 43.4 (CVP 8 @ that time) and dobutamine initiated.  Repeat Co-ox just now  64.9 (CRRT off for this draw).  ECG shows sinus tach w/ ant TWI (likely old based on written report from 5/9 ECG).  Stat echo just performed  EF 60-65% (prelim). Rec d/c dobutamine.    2.  Hypotension:  See above.  Pressor mgmt per CCM. D/c dobutamine.  3.  ESRD:  On CRRT.  Per Nephrology.  4.  Anemia:  Stable.  5.  PAF:  In sinus.  Was on coumadin prior to admission. INR Rx.  6.  HFpEF:  STAT echo continues to show Nl LV fxn.  D/c dobutamine.  CVP 8-9, thus volume stable.  Signed, Murray Hodgkins, NP 12/01/2017, 1:17 PM For questions or updates, please contact   Please consult [email or web address] for contact info under  Cardiology/STEMI.  The patient was seen, examined and discussed with Ignacia Bayley, NP and agree as above.  72 y.o. female with a history of HFpEF, CKD IV  ESRD since 10/2017, COPD and resp failure s/p PNA 10/2017, HTN, DMII, GIB 10/2017, PAF, and stroke.  Pt is currently intubated and sedated.  She was  originally admitted to [facility] in early May with encephalopathy and resp failure req intubation, and was subsequently tx to [facility] in [place] on 10/29/2017 where she was treated for sepsis, ongoing resp failure, GIB (eliquis d/c'd), and subsequently stroke, w/ MRI showing acute cortical infarct in bilat parietal lobes, bilat post paramedian parietal lobes, and R occipital lobe, and a small focal acute infarct in white matter of R cerebellar hemisphere. Echo showed nl LV fxn. Due to ongoing renal failure, she required initiation of HD.  She underwent tunneled catheter placement on 5/20, and PEG placement on 5/22.  At some point, she was placed on coumadin.  She was able to be extubated and transitioned to BiPap but was not able to be weaned from BiPap, and was thus tx to [facility] @ hosp d/c.  She decompensated on 6/6 and required intubation and vasopressor therapy.  Decision was made to seek admission to [facility] on 6/7 for CRRT and further mgmt of hypotension and resp failure.   She is currently in medical ICU, remains intubated and sedated, she opens eyes to command.  She became hypotensive last night and her Levophed was increased to 25 mics per kilogram per minute.  Her Co-ox were checked, and results were low at 43 and she was started on dobutamine infusion at 5 mcg/kg/min.  The patient remains hypotensive, cardiology was called for management of potential CHF and cardiogenic shock.  Repeat Co-ox at 1 PM were 65%. We have performed an echocardiogram here that shows hyperdynamic LVEF approximately 65%, right ventricle appears moderately dilated with mild to moderate systolic dysfunction. The  patient is on CRRT for fluid overload.  Given hyperdynamic LVEF, first Co-ox test is most probably falsely abnormal, hypotension most probably related to septic shock, in this case use of norepinephrine and vasopressin would be superior to use dobutamine.  I would recommend to discontinue dobutamine.  RV dilatation and mild dysfunction can be explained by long-term ventilation, pneumonia and respiratory failure. Continue CRRT for fluid overload. Co-ox can be abnormal with anemia and if blood sample obtained from peripheral blood in the settings of sepsis.  Ena Dawley, MD 12/01/2017

## 2017-12-01 NOTE — Progress Notes (Addendum)
Pharmacy Antibiotic Note  Sheila Cabrera is a 72 y.o. female admitted on 11/30/2017 with pneumonia.  Pharmacy has been consulted for Zosyn and vancomycin dosing. WBC 12.3. SCr 2.45>2.09. PCT 0.59. Patient is on CRRT.   Plan: -Zosyn 3.375 gm IV load, then Zosyn 3.375 gm IV Q 6 hours  -Vancomycin 1750 mg IV once, then vancomycin 1 gm IV Q 24 hours  -Monitor CBC, renal fx, cultures, and clinical progress -VT at SS   Height: 5\' 6"  (167.6 cm)(Per [facility] clinic documents) Weight: 192 lb 14.4 oz (87.5 kg) IBW/kg (Calculated) : 59.3  Temp (24hrs), Avg:98 F (36.7 C), Min:97.3 F (36.3 C), Max:99.9 F (37.7 C)  Recent Labs  Lab 11/30/17 2126 11/30/17 2130 12/01/17 0401  WBC 12.1*  --  12.3*  CREATININE 2.45*  --  2.09*  LATICACIDVEN  --  1.0  --     Estimated Creatinine Clearance: 27.9 mL/min (A) (by C-G formula based on SCr of 2.09 mg/dL (H)).    No Known Allergies  Antimicrobials this admission: Vanc 6/8 >> Zosyn 6/8 >>   Dose adjustments this admission: None   Microbiology results: 6/7 BCx >> 6/7 TA>> 6/7 MRSA PCR: neg  Thank you for allowing pharmacy to be a part of this patient's care.  Vinnie LevelBenjamin Shaniqua Guillot, PharmD., BCPS Clinical Pharmacist Clinical phone for 12/01/17 until 3:30pm: [phone] If after 3:30pm, please call main pharmacy at: [phone]

## 2017-12-01 NOTE — H&P (Signed)
PULMONARY / CRITICAL CARE MEDICINE   Name: Sheila Cabrera MRN: [id number] DOB: 09/20/1945    ADMISSION DATE:  11/30/2017 CONSULTATION DATE:  11/30/2017  REFERRING MD:  [facility]   CHIEF COMPLAINT:  Respiratory Distress   HISTORY OF PRESENT ILLNESS:   72 year old female with PMH of DM, COPD, Diastolic HF, ESRD, CVA, Dysphagia s/p PEG  [facility] 5/[facility] > Presented with hypoxia/AMS secondary to PNA requiring intubation, [facility]. Due to GI bleed Eliquis was held, during this time developed acute bilateral cortical strokes. Previously had Stage 4 CKD however this progressed to ESRD with needs for HD. Extubated (unknown time frame), however due to inability to wean off BiPAP patient was transferred to [facility] on 5/31.    While at [facility] was intermittently off BiPAP for short periods. On 6/7 required intubation for respiratory distress. Due to inability to tolerate HD with bradycardia and hypotension patient was [facility]     SUBJECTIVE:  Ill-appearing female with a high FiO2 needs.  VITAL SIGNS: BP (!) 116/57   Pulse 99   Temp 97.9 F (36.6 C) (Oral)   Resp (!) 26   Wt 87.5 kg (192 lb 14.4 oz)   SpO2 96%   HEMODYNAMICS:    VENTILATOR SETTINGS: Vent Mode: PRVC FiO2 (%):  [50 %-80 %] 80 % Set Rate:  [26 bmp] 26 bmp Vt Set:  [500 mL] 500 mL PEEP:  [10 cmH20] 10 cmH20 Plateau Pressure:  [22 cmH20-30 cmH20] 30 cmH20  INTAKE / OUTPUT: I/O last 3 completed shifts: In: 347.4 [I.V.:317.4; Other:30] Out: 1255 [Other:1255]  PHYSICAL EXAMINATION: General: Frail ill-appearing female currently sedated on vent HEENT: Endotracheal tube to ventilator, no JVD  is appreciated Neuro: Sedated on the vent reported to wake up follow commands CV: Heart sounds regular regular rate and rhythm currently sinus rhythm has a history of atrial fibrillation PULM: Decreased breath sounds in bases coarse rhonchi WU:JWJXGI:soft, non-tender, bsx4 active, PEG in  place Extremities: warm/dry, 1+ edema  Skin: no rashes or lesions   LABS:  BMET Recent Labs  Lab 11/30/17 2126 12/01/17 0401  NA 136 136  K 4.5 4.6  CL 97* 97*  CO2 31 30  BUN 54* 47*  CREATININE 2.45* 2.09*  GLUCOSE 136* 114*    Electrolytes Recent Labs  Lab 11/30/17 2126 12/01/17 0401  CALCIUM 9.0 9.1  MG 2.1  --   PHOS 1.9* 1.9*    CBC Recent Labs  Lab 11/30/17 2126 12/01/17 0401  WBC 12.1* 12.3*  HGB 8.6* 8.5*  HCT 29.2* 29.2*  PLT 175 178    Coag's Recent Labs  Lab 11/30/17 2126 12/01/17 0401  INR 2.45 2.40    Sepsis Markers Recent Labs  Lab 11/30/17 2126 11/30/17 2130 12/01/17 0401  LATICACIDVEN  --  1.0  --   PROCALCITON 0.70  --  0.59    ABG Recent Labs  Lab 11/30/17 2226 12/01/17 0355  PHART 7.372 7.460*  PCO2ART 50.6* 41.3  PO2ART 53.0* 62.1*    Liver Enzymes Recent Labs  Lab 12/01/17 0401  ALBUMIN 2.9*    Cardiac Enzymes Recent Labs  Lab 11/30/17 2126 12/01/17 0401  TROPONINI 0.06* 0.05*    Glucose Recent Labs  Lab 11/30/17 2055 12/01/17 0006 12/01/17 0357 12/01/17 0729  GLUCAP 109* 120* 111* 133*    Imaging Dg Chest Port 1 View  Result Date: 11/30/2017 CLINICAL DATA:  Endotracheal tube repositioning. EXAM: PORTABLE CHEST 1 VIEW COMPARISON:  Chest radiograph performed earlier today  at 8:59 p.m. FINDINGS: The patient's endotracheal tube is seen ending 5 cm above the carina. A right-sided dual-lumen catheter is noted ending about the distal SVC. A left IJ line is noted ending about the mid SVC. There is mildly worsened vascular congestion. Bibasilar airspace opacities raise concern for mildly worsening pulmonary edema. Small bilateral pleural effusions are noted. No pneumothorax is seen. The cardiomediastinal silhouette is normal in size. No acute osseous abnormalities are identified. IMPRESSION: 1. Endotracheal tube seen ending 5 cm above the carina. 2. Mildly worsened vascular congestion noted. Bibasilar  airspace opacities raise concern for mildly worsening pulmonary edema. Small bilateral pleural effusions noted. Electronically Signed   By: Roanna Raider M.D.   On: 11/30/2017 23:42   Dg Chest Port 1 View  Result Date: 11/30/2017 CLINICAL DATA:  Follow-up aeration on ventilation-dependent patient. EXAM: PORTABLE CHEST 1 VIEW COMPARISON:  None. FINDINGS: The patient's endotracheal tube is seen ending 8 cm above the carina. A left IJ line is noted ending about the mid SVC. A right-sided dual-lumen catheter is noted ending about the distal SVC. Small bilateral pleural effusions are noted. Hazy bibasilar airspace opacification may reflect pulmonary edema or possibly pneumonia. No pneumothorax is seen. The cardiomediastinal silhouette is normal in size. No acute osseous abnormalities are identified. IMPRESSION: 1. Endotracheal tube seen ending 8 cm above the carina. 2. Left IJ line noted ending about the mid SVC. 3. Small bilateral pleural effusions noted. Hazy bibasilar airspace opacification may reflect pulmonary edema or possibly pneumonia. Electronically Signed   By: Roanna Raider M.D.   On: 11/30/2017 21:19     STUDIES:  CXR 6/7 > The patient's endotracheal tube is seen ending 8 cm above the carina. A left IJ line is noted ending about the mid SVC. A right-sided dual-lumen catheter is noted ending about the distal SVC. Small bilateral pleural effusions are noted. Hazy bibasilar airspace opacification may reflect pulmonary edema or possibly pneumonia. No pneumothorax is seen. The cardiomediastinal silhouette is normal in size. No acute osseous abnormalities are identified.  CULTURES: Blood 6/7 >> Sputum 6/7 >>  ANTIBIOTICS: None.   SIGNIFICANT EVENTS: 6/7 > Transported to [facility]  12/01/2017 started on dobutamine drip 12/01/2017 cardiology consult  LINES/TUBES: CVC Left IJ 6/7 (OSH) >>  ETT 6/6 (OSH) >> Right IJ Tunneled HD >>  PEG >>    DISCUSSION: 72 year old female with prolonged  hospitalization secondary to respiratory distress and ESRD, re-intubated on 6/7 and transferred to [facility] for CRRT   ASSESSMENT / PLAN:  PULMONARY A: Acute on Chronic Hypoxic Respiratory Distress in setting of decompensated heart failure with pulmonary edema vs PNA  H/O COPD, 30 year smoking history  P:   Continue ventilatory support via endotracheal tube, requiring high flow FiO2 currently at 80%.  Renal is pulling fluid as her blood pressure will tolerate Wean per protocol Note history of COPD Note history of heart failure May need tracheostomy to be liberated from mechanical ventilatory support  CARDIOVASCULAR A:  Hypotension in setting of CardioRenal Syndrome vs Sepsis  H/O HTN, CHF, A.Fib (On Coumadin)  P:  Reported history of atrial fibrillation, currently in sinus rhythm, currently on heparin drip in lieu of Coumadin Dobutamine started for Cholox less than 50 Remains on Levophed weaning as tolerated CVP noted to be 8 Cardiology consult called 12/01/2017  RENAL A:   ESRD on HD MWF  -Not tolerating HD due to bradycardia/hypotension  -LA 1.0  P:   Nephrology is following Currently on CRRT  Was pulling 200 cc an hour CVP is 8 we will consider being euvolemic she is off pressors  GASTROINTESTINAL A:   SUP  Dysphagia s/p PEG  P:   N.p.o. Will need to start tube feeds   HEMATOLOGIC Recent Labs    11/30/17 2126 12/01/17 0401  HGB 8.6* 8.5*   Lab Results  Component Value Date   INR 2.40 12/01/2017   INR 2.45 11/30/2017    A:   Anemia of Chronic Disease  P:  Trend hemoglobin Transfuse per protocol Note elevated INR (she was on Coumadin at previous facility INFECTIOUS A:   Recent CAP  P:   Procalcitonin 0.56 White count 12.7 T-max 99.9 She has been multiple facilities over the last few months she has a lot of potential for hospital-acquired infections we will continue to monitor her white count and temperature curve currently not on  antibiotics  ENDOCRINE CBG (last 3)  Recent Labs    12/01/17 0006 12/01/17 0357 12/01/17 0729  GLUCAP 120* 111* 133*    A:   H/O DM    P:   Sliding scale insulin protocol  NEUROLOGIC A:   Sedation Needs  CVA with left sided hemiplegia H/O Depression/Anxiety   P:   RA SS goal 0 to -1 Sedation as tolerated Daily work-up assessment   FAMILY  - Updates: Son -Ginamarie [facility] - updated via phone.  12/01/2017 no family at bedside at time of examination  - Inter-disciplinary family meet or Palliative Care meeting due by: 12/07/2017  CC Time: 45 min  Brett Canales Minor ACNP Ad[facility]ack PCCM Pager [phone] till 1 pm If no answer page [phone] 12/01/2017, 7:39 AM

## 2017-12-01 NOTE — Progress Notes (Signed)
Initial Nutrition Assessment  DOCUMENTATION CODES:  Not applicable  INTERVENTION:  Initiate TF via PEG with Vital AF 1.2 at goal rate of 50 ml/h (1200 ml per day) and Prostat 60 ml BID to provide 1840 kcals, 150 gm protein, 973 ml free water daily.  NUTRITION DIAGNOSIS:  Inadequate oral intake related to inability to eat as evidenced by NPO status.  GOAL:  Patient will meet greater than or equal to 90% of their needs  MONITOR:  Vent status, TF tolerance, Labs, I & O's, Skin  REASON FOR ASSESSMENT:  Ventilator, Consult(Verbal) Enteral/tube feeding initiation and management  ASSESSMENT:  72 y/o female PMHx HF, Afib, COPD, DM2, HTN. 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 Tx to [facility] 5/31. Now presents with resp distress/hypotension. Intubated. CRRT started due to hypotension/inability to tolerate HD. RD given V.O for TF via Peg.   Pt intubated, sedated. No historians present on RD arrival. Reviewed hospitalization notes from [facility] clinic via Care Everywhere. Patient apparently was initially on Osmolite 1.5 @ 45 w/ Prosource Plus TID w/ FWF 50 ml q4hrs, though this was recommended to be changed to Nepro @ 40cc/hr w/ prosource plus TID due to fluid overload. This TF regimen would provide:2028 kcals, 123g Pro, fluid  No wt history available in [facility] system, though last RD note at [facility] clinic, following wt measurements noted:   Height: 167.6 cm (5\' 6" ) Weight: 93.1 kg (205 lb 3.2 oz), 5/29 bed scale  Weight: 90.7 kg (200#), 5/20 bed scale Weight: 87.6 kg (193#), 5/17 unknown method Weight: 85.2 kg (187#), 5/11 bed scale Weight: 80.5 kg (177#), 5/6 bed scale (admit)   The patients current bed weight is 87.3 kg. Renal MD notes she is volume overloaded. Will use admit weight from 5/6 as dosing weight, as likely closest to dry weight.  Will transfer ht from  [facility] clinic to current chart.   NFPE: Severe temporal wasting-only area with discernible wasting. Has generalized swelling. Abdomen is firm/distended.  Patient is currently intubated on ventilator support MV: 13.2 L/min Temp (24hrs), Avg:98.2 F (36.8 C), Min:97.3 F (36.3 C), Max:99.9 F (37.7 C) Propofol: None  Labs: BG: 110-135, Albumin:2.9 (prealbumin:2--wdl) Phos:1.9, WBC:12.3, Hgb:8.5 Meds: IVF, ppi Sedation/analgesia: Fentanyl Vasopressor/Inotrope support: Levophed, Dobutamine  Recent Labs  Lab 11/30/17 2126 12/01/17 0401  NA 136 136  K 4.5 4.6  CL 97* 97*  CO2 31 30  BUN 54* 47*  CREATININE 2.45* 2.09*  CALCIUM 9.0 9.1  MG 2.1  --   PHOS 1.9* 1.9*  GLUCOSE 136* 114*    NUTRITION - FOCUSED PHYSICAL EXAM:   Most Recent Value  Orbital Region  No depletion  Upper Arm Region  No depletion  Thoracic and Lumbar Region  No depletion  Buccal Region  No depletion  Temple Region  Severe depletion  Clavicle Bone Region  No depletion  Clavicle and Acromion Bone Region  No depletion  Scapular Bone Region  No depletion  Dorsal Hand  No depletion  Patellar Region  No depletion  Anterior Thigh Region  No depletion  Posterior Calf Region  No depletion  Edema (RD Assessment)  Mild     Diet Order:   Diet Order    None     EDUCATION NEEDS:  No education needs have been identified at this time  Skin: PU stage II (mid-sacrum)  Last BM:  Unknown  Height:  Ht Readings from Last 1 Encounters:  12/01/17 5\' 6"  (1.676 m)   Weight:  Wt Readings from Last 1 Encounters:  12/01/17 192 lb 14.4 oz (87.5 kg)   Ideal Body Weight:  59.1 kg  BMI:  Body mass index using dry weight is 28.7 kg/m.  Dry weight: 80.5 kg Estimated Nutritional Needs:  Kcal:  1785 kcals (PSU 2003 b) Protein:  137-153g Pro (1.7-1.9 g/kg dry weight) Fluid:  Per MD goals  Christophe LouisNathan Franks RD, LDN, CNSC Clinical Nutrition Available Tues-Sat via Pager: [phone] 12/01/2017 11:45 AM

## 2017-12-01 NOTE — Progress Notes (Signed)
Patient ID: Sheila Cabrera, female   DOB: 1946/04/03, 72 y.o.   MRN: [id number] [facility] Progress Note   Assessment/ Plan:   1.  Volume overload/pulmonary edema/ventilator dependent respiratory failure:  Continue efforts at volume unloading with CRRT-prohibited by hypotension requiring pressor/inotropic.   2.  End-stage renal disease:  transferred from [facility] after inability to tolerate ultrafiltration with conventional intermittent hemodialysis because of pressor dependent hypotension.  Ongoing evaluation for cardiogenic shock and started on CRRT for efforts at volume unloading.  Ultrafiltration limited by hypotension.  Unclear whether she will have sufficient recovery to undertake intermittent hemodialysis safely. 3.  Anemia of chronic kidney disease: Status post PRBC on 11/26/2017 and will restart ESA-she is status post intravenous iron at [facility]. 4.  Shock-suspected to be cardiogenic: Currently on dobutamine and Levophed drip as we attempt volume unloading with CRRT.  Awaiting cardiology evaluation.. 5.  History of atrial fibrillation: On heparin drip, awaiting cardiology evaluation. 6.  History of recent CVA with left-sided hemiplegia  Subjective:   Able to tolerate ultrafiltration overnight however, none this morning because of prohibitive hypotension.   Objective:   BP (!) 100/51   Pulse (!) 103   Temp (!) 97.3 F (36.3 C) (Axillary)   Resp (!) 26   Wt 87.5 kg (192 lb 14.4 oz)   SpO2 98%   Intake/Output Summary (Last 24 hours) at 12/01/2017 0930 Last data filed at 12/01/2017 0900 Gross per 24 hour  Intake 534.52 ml  Output 1516 ml  Net -981.48 ml   Weight change:   Physical Exam: Gen: Comfortable, intubated, sedated CVS: Irregularly irregular tachycardia, S1 and S2 normal Resp: Anteriorly clear to auscultation, no rales/rhonchi Abd: Soft, flat, nontender Ext: 1+ lower extremity/dependent edema  Imaging: Dg Chest Port 1 View  Result Date:  11/30/2017 CLINICAL DATA:  Endotracheal tube repositioning. EXAM: PORTABLE CHEST 1 VIEW COMPARISON:  Chest radiograph performed earlier today at 8:59 p.m. FINDINGS: The patient's endotracheal tube is seen ending 5 cm above the carina. A right-sided dual-lumen catheter is noted ending about the distal SVC. A left IJ line is noted ending about the mid SVC. There is mildly worsened vascular congestion. Bibasilar airspace opacities raise concern for mildly worsening pulmonary edema. Small bilateral pleural effusions are noted. No pneumothorax is seen. The cardiomediastinal silhouette is normal in size. No acute osseous abnormalities are identified. IMPRESSION: 1. Endotracheal tube seen ending 5 cm above the carina. 2. Mildly worsened vascular congestion noted. Bibasilar airspace opacities raise concern for mildly worsening pulmonary edema. Small bilateral pleural effusions noted. Electronically Signed   By: Garald Balding M.D.   On: 11/30/2017 23:42   Dg Chest Port 1 View  Result Date: 11/30/2017 CLINICAL DATA:  Follow-up aeration on ventilation-dependent patient. EXAM: PORTABLE CHEST 1 VIEW COMPARISON:  None. FINDINGS: The patient's endotracheal tube is seen ending 8 cm above the carina. A left IJ line is noted ending about the mid SVC. A right-sided dual-lumen catheter is noted ending about the distal SVC. Small bilateral pleural effusions are noted. Hazy bibasilar airspace opacification may reflect pulmonary edema or possibly pneumonia. No pneumothorax is seen. The cardiomediastinal silhouette is normal in size. No acute osseous abnormalities are identified. IMPRESSION: 1. Endotracheal tube seen ending 8 cm above the carina. 2. Left IJ line noted ending about the mid SVC. 3. Small bilateral pleural effusions noted. Hazy bibasilar airspace opacification may reflect pulmonary edema or possibly pneumonia. Electronically Signed   By: Garald Balding M.D.   On: 11/30/2017 21:19  Labs: BMET Recent Labs  Lab  11/30/17 2126 12/01/17 0401  NA 136 136  K 4.5 4.6  CL 97* 97*  CO2 31 30  GLUCOSE 136* 114*  BUN 54* 47*  CREATININE 2.45* 2.09*  CALCIUM 9.0 9.1  PHOS 1.9* 1.9*   CBC Recent Labs  Lab 11/30/17 2126 12/01/17 0401  WBC 12.1* 12.3*  HGB 8.6* 8.5*  HCT 29.2* 29.2*  MCV 96.7 96.4  PLT 175 178    Medications:    . atropine      . chlorhexidine gluconate (MEDLINE KIT)  15 mL Mouth Rinse BID  . Chlorhexidine Gluconate Cloth  6 each Topical Daily  . fentaNYL (SUBLIMAZE) injection  50 mcg Intravenous Once  . insulin aspart  2-6 Units Subcutaneous Q4H  . ipratropium-albuterol  3 mL Nebulization Q6H  . mouth rinse  15 mL Mouth Rinse 10 times per day  . pantoprazole sodium  40 mg Per Tube Daily   Elmarie Shiley, MD 12/01/2017, 9:30 AM

## 2017-12-01 NOTE — Progress Notes (Signed)
This note also relates to the following rows which could not be included: SpO2 - Cannot attach notes to unvalidated device data  Et Tube advanced to 29 cm and the Hub of the Et Tube. Patient didn't tolerate very well and desated to low 80's with good wave form, increased patient to 80% and she is now re-cooperating. No further issues at this time. Will continue to wean O2 as tolerated.

## 2017-12-01 NOTE — Progress Notes (Signed)
  Echocardiogram 2D Echocardiogram has been performed.  Sheila Cabrera, Sheila Cabrera 12/01/2017, 2:27 PM

## 2017-12-02 ENCOUNTER — Inpatient Hospital Stay (HOSPITAL_COMMUNITY): Payer: Medicare Other

## 2017-12-02 LAB — CBC WITH DIFFERENTIAL/PLATELET
Abs Immature Granulocytes: 0.1 10*3/uL (ref 0.0–0.1)
BASOS PCT: 1 %
Basophils Absolute: 0.1 10*3/uL (ref 0.0–0.1)
EOS ABS: 0.1 10*3/uL (ref 0.0–0.7)
EOS PCT: 1 %
HEMATOCRIT: 31.6 % — AB (ref 36.0–46.0)
Hemoglobin: 9.1 g/dL — ABNORMAL LOW (ref 12.0–15.0)
Immature Granulocytes: 1 %
LYMPHS ABS: 0.4 10*3/uL — AB (ref 0.7–4.0)
Lymphocytes Relative: 2 %
MCH: 28.4 pg (ref 26.0–34.0)
MCHC: 28.8 g/dL — ABNORMAL LOW (ref 30.0–36.0)
MCV: 98.8 fL (ref 78.0–100.0)
MONO ABS: 1.6 10*3/uL — AB (ref 0.1–1.0)
Monocytes Relative: 10 %
Neutro Abs: 13.5 10*3/uL — ABNORMAL HIGH (ref 1.7–7.7)
Neutrophils Relative %: 85 %
PLATELETS: 207 10*3/uL (ref 150–400)
RBC: 3.2 MIL/uL — ABNORMAL LOW (ref 3.87–5.11)
RDW: 19.1 % — AB (ref 11.5–15.5)
WBC: 15.7 10*3/uL — ABNORMAL HIGH (ref 4.0–10.5)

## 2017-12-02 LAB — RENAL FUNCTION PANEL
ALBUMIN: 3 g/dL — AB (ref 3.5–5.0)
ANION GAP: 9 (ref 5–15)
Albumin: 3.1 g/dL — ABNORMAL LOW (ref 3.5–5.0)
Anion gap: 9 (ref 5–15)
BUN: 23 mg/dL — AB (ref 6–20)
BUN: 20 mg/dL (ref 6–20)
CALCIUM: 9.1 mg/dL (ref 8.9–10.3)
CHLORIDE: 97 mmol/L — AB (ref 101–111)
CO2: 30 mmol/L (ref 22–32)
CO2: 27 mmol/L (ref 22–32)
CREATININE: 1.12 mg/dL — AB (ref 0.44–1.00)
Calcium: 8.6 mg/dL — ABNORMAL LOW (ref 8.9–10.3)
Chloride: 98 mmol/L — ABNORMAL LOW (ref 101–111)
Creatinine, Ser: 1.24 mg/dL — ABNORMAL HIGH (ref 0.44–1.00)
GFR calc Af Amer: 50 mL/min — ABNORMAL LOW (ref >60)
GFR calc non Af Amer: 43 mL/min — ABNORMAL LOW (ref >60)
GFR, EST AFRICAN AMERICAN: 56 mL/min — AB (ref >60)
GFR, EST NON AFRICAN AMERICAN: 49 mL/min — AB (ref >60)
Glucose, Bld: 135 mg/dL — ABNORMAL HIGH (ref 65–99)
Glucose, Bld: 225 mg/dL — ABNORMAL HIGH (ref 65–99)
PHOSPHORUS: 2.3 mg/dL — AB (ref 2.5–4.6)
POTASSIUM: 4.1 mmol/L (ref 3.5–5.1)
Phosphorus: 1.9 mg/dL — ABNORMAL LOW (ref 2.5–4.6)
Potassium: 4.4 mmol/L (ref 3.5–5.1)
SODIUM: 137 mmol/L (ref 135–145)
Sodium: 133 mmol/L — ABNORMAL LOW (ref 135–145)

## 2017-12-02 LAB — PROTIME-INR
INR: 2.82
Prothrombin Time: 29.5 seconds — ABNORMAL HIGH (ref 11.4–15.2)

## 2017-12-02 LAB — MAGNESIUM: Magnesium: 2.6 mg/dL — ABNORMAL HIGH (ref 1.7–2.4)

## 2017-12-02 LAB — GLUCOSE, CAPILLARY
GLUCOSE-CAPILLARY: 132 mg/dL — AB (ref 65–99)
GLUCOSE-CAPILLARY: 120 mg/dL — AB (ref 65–99)
GLUCOSE-CAPILLARY: 137 mg/dL — AB (ref 65–99)
Glucose-Capillary: 146 mg/dL — ABNORMAL HIGH (ref 65–99)
Glucose-Capillary: 161 mg/dL — ABNORMAL HIGH (ref 65–99)
Glucose-Capillary: 173 mg/dL — ABNORMAL HIGH (ref 65–99)

## 2017-12-02 LAB — PROCALCITONIN: Procalcitonin: 0.82 ng/mL

## 2017-12-02 MED ORDER — MIDODRINE HCL 5 MG PO TABS
5.0000 mg | ORAL_TABLET | Freq: Three times a day (TID) | ORAL | Status: DC
Start: 2017-12-02 — End: 2017-12-06
  Administered 2017-12-02 – 2017-12-06 (×11): 5 mg via ORAL
  Filled 2017-12-02 (×11): qty 1

## 2017-12-02 MED ORDER — DOCUSATE SODIUM 50 MG/5ML PO LIQD
100.0000 mg | Freq: Two times a day (BID) | ORAL | Status: DC | PRN
  Administered 2017-12-02 – 2017-12-03 (×2): 100 mg via ORAL
  Filled 2017-12-02 (×2): qty 10

## 2017-12-02 NOTE — Progress Notes (Signed)
PULMONARY / CRITICAL CARE MEDICINE   Name: Sheila Cabrera MRN: [id number] DOB: 1945-06-28    ADMISSION DATE:  11/30/2017 CONSULTATION DATE:  11/30/2017  REFERRING MD:  [facility]   CHIEF COMPLAINT:  Respiratory Distress   HISTORY OF PRESENT ILLNESS:   72 year old female with PMH of DM, COPD, Diastolic HF, ESRD, CVA, Dysphagia s/p PEG  [facility] 5/6 > Presented with hypoxia/AMS secondary to PNA requiring intubation, transferred to [facility] 5/6. Due to GI bleed Eliquis was held, during this time developed acute bilateral cortical strokes. Previously had Stage 4 CKD however this progressed to ESRD with needs for HD. Extubated (unknown time frame), however due to inability to wean off BiPAP patient was transferred to [facility] on 5/31.    While at [facility] was intermittently off BiPAP for short periods. On 6/7 required intubation for respiratory distress. Due to inability to tolerate HD with bradycardia and hypotension patient was transferred to [facility].     SUBJECTIVE:  Ill-appearing female was still requiring high FiO2 needs  VITAL SIGNS: BP 90/60   Pulse 98   Temp (!) 97.1 F (36.2 C) (Axillary)   Resp (!) 26   Ht 5\' 6"  (1.676 m) Comment: Per [facility] clinic documents  Wt 87.2 kg (192 lb 3.9 oz)   SpO2 94%   BMI 31.03 kg/m   HEMODYNAMICS: CVP:  [9 mmHg-10 mmHg] 9 mmHg  VENTILATOR SETTINGS: Vent Mode: PRVC FiO2 (%):  [80 %-100 %] 80 % Set Rate:  [26 bmp] 26 bmp Vt Set:  [500 mL-510 mL] 510 mL PEEP:  [10 cmH20] 10 cmH20 Plateau Pressure:  [27 cmH20-31 cmH20] 27 cmH20  INTAKE / OUTPUT: I/O last 3 completed shifts: In: 3675.2 [I.V.:2168.5; Other:30; NG/GT:826.7; IV Piggyback:650] Out: 5829 [Urine:20; Other:5809]  PHYSICAL EXAMINATION: General: Frail elderly female who is currently sedated on mechanical ventilatory support HEENT: Tracheal tube to vent Neuro: Moves right arm spontaneous will follow commands intermittently CV: Currently in sinus rhythm,  remains on Levophed for blood pressure support PULM: Coarse rhonchi bilaterally WU:JWJX, non-tender, bsx4 active, PEG in place Extremities: warm/dry, 2+ edema lower extremity foot drop is noted Skin: no rashes or lesions    LABS:  BMET Recent Labs  Lab 12/01/17 0401 12/01/17 1614 12/02/17 0420  NA 136 136 137  K 4.6 4.5 4.4  CL 97* 98* 98*  CO2 30 29 30   BUN 47* 30* 23*  CREATININE 2.09* 1.46* 1.24*  GLUCOSE 114* 135* 135*    Electrolytes Recent Labs  Lab 11/30/17 2126 12/01/17 0401 12/01/17 1614 12/02/17 0420  CALCIUM 9.0 9.1 8.7* 9.1  MG 2.1  --   --  2.6*  PHOS 1.9* 1.9* 1.8* 1.9*    CBC Recent Labs  Lab 11/30/17 2126 12/01/17 0401 12/02/17 0420  WBC 12.1* 12.3* 15.7*  HGB 8.6* 8.5* 9.1*  HCT 29.2* 29.2* 31.6*  PLT 175 178 207    Coag's Recent Labs  Lab 11/30/17 2126 12/01/17 0401 12/02/17 0420  INR 2.45 2.40 2.82    Sepsis Markers Recent Labs  Lab 11/30/17 2126 11/30/17 2130 12/01/17 0401 12/02/17 0420  LATICACIDVEN  --  1.0  --   --   PROCALCITON 0.70  --  0.59 0.82    ABG Recent Labs  Lab 11/30/17 2226 12/01/17 0355  PHART 7.372 7.460*  PCO2ART 50.6* 41.3  PO2ART 53.0* 62.1*    Liver Enzymes Recent Labs  Lab 12/01/17 0401 12/01/17 1614 12/02/17 0420  ALBUMIN 2.9* 3.0* 3.1*    Cardiac Enzymes Recent Labs  Lab 11/30/17 2126 12/01/17 0401 12/01/17 1302  TROPONINI 0.06* 0.05* 0.05*    Glucose Recent Labs  Lab 12/01/17 1132 12/01/17 1533 12/01/17 1921 12/01/17 2315 12/02/17 0427 12/02/17 0734  GLUCAP 156* 124* 131* 123* 132* 120*    Imaging Dg Chest Port 1 View  Result Date: 12/02/2017 CLINICAL DATA:  Hypoxia EXAM: PORTABLE CHEST 1 VIEW COMPARISON:  November 30, 2017 FINDINGS: Endotracheal tube tip is 4.6 cm above the carina. Right-sided central catheter tip is at cavoatrial junction. Left-sided central catheter tip is in superior vena cava. No pneumothorax. There are pleural effusions bilaterally with patchy  interstitial and alveolar edema in the lower lung zones. There is consolidation throughout the left lower lobe. Heart is mildly enlarged with pulmonary vascularity normal. There is aortic atherosclerosis. No adenopathy. Bones are osteoporotic. IMPRESSION: Tube and catheter positions as described without pneumothorax. Airspace consolidation concerning for pneumonia left lower lobe. Pleural effusions bilaterally persists with interstitial and patchy alveolar edema. Appearance of the lungs is similar to 2 days prior. There is aortic atherosclerosis. Aortic Atherosclerosis (ICD10-I70.0). Electronically Signed   By: Bretta BangWilliam  Woodruff III M.D.   On: 12/02/2017 09:18     STUDIES:  CXR 6/7 > The patient's endotracheal tube is seen ending 8 cm above the carina. A left IJ line is noted ending about the mid SVC. A right-sided dual-lumen catheter is noted ending about the distal SVC. Small bilateral pleural effusions are noted. Hazy bibasilar airspace opacification may reflect pulmonary edema or possibly pneumonia. No pneumothorax is seen. The cardiomediastinal silhouette is normal in size. No acute osseous abnormalities are identified.  CULTURES: Blood 6/7 >> Sputum 6/7 >>  ANTIBIOTICS: 12/01/2017 Zosyn>> 12/01/2017 vancomycin>>  SIGNIFICANT EVENTS: 6/7 > Transported to [facility]  12/01/2017 started on dobutamine drip 12/01/2017 cardiology consult  LINES/TUBES: CVC Left IJ 6/7 (OSH) >>  ETT 6/6 (OSH) >> Right IJ Tunneled HD >>  PEG >>    DISCUSSION: 72 year old female with prolonged hospitalization secondary to respiratory distress and ESRD, re-intubated on 6/7 and transferred to [facility]   ASSESSMENT / PLAN:  PULMONARY A: Acute on Chronic Hypoxic Respiratory Distress in setting of decompensated heart failure with pulmonary edema vs PNA  H/O COPD, 30 year smoking history  P:   Continue ventilatory support Still required oxygen greater than 80% May need tracheostomy tube become  liberated from mechanical ventilatory support  CARDIOVASCULAR A:  Hypotension in setting of CardioRenal Syndrome vs Sepsis  H/O HTN, CHF, A.Fib (On Coumadin)  P:  History of atrial fibrillation currently sinus rhythm will be placed back on a heparin drip once her INR is less than 2 Levophed to keep blood pressure systolic greater than 90 Dobutamine is been discontinued  RENAL A:   [facility] on HD MWF  CRRT due to bradycardia and hypotension P:   Nephrology is following Currently on CRRT  GASTROINTESTINAL A:   SUP  Dysphagia s/p PEG  P:    Tube feeds N.p.o.   HEMATOLOGIC Recent Labs    12/01/17 0401 12/02/17 0420  HGB 8.5* 9.1*   Lab Results  Component Value Date   INR 2.82 12/02/2017   INR 2.40 12/01/2017   INR 2.45 11/30/2017    A:   Anemia of Chronic Disease  P:  Transfuse per protocol To resume IV heparin once her INR is less than 2   INFECTIOUS A:   Recent CAP  P:   Continue current antibiotics All other support as needed  ENDOCRINE CBG (last  3)  Recent Labs    12/01/17 2315 12/02/17 0427 12/02/17 0734  GLUCAP 123* 132* 120*    A:   H/O DM    P:   Sliding scale insulin as needed  NEUROLOGIC A:   Sedation Needs  CVA with left sided hemiplegia H/O Depression/Anxiety   P:   RA SS 0 to -1 Sedation as needed for management   FAMILY  - Updates: Son -Dashawna Delbridge [facility] - updated via phone.  12/02/2017 no family at bedside - Inter-disciplinary family meet or Palliative Care meeting due by: 12/07/2017  CC Time: 45 min  Brett Canales Minor ACNP [facility]ack PCCM Pager [phone] till 1 pm If no answer page [phone] 12/02/2017, 11:24 AM

## 2017-12-02 NOTE — Progress Notes (Signed)
Patient ID: Sheila Cabrera, female   DOB: Apr 10, 1946, 72 y.o.   MRN: [id number] [facility] Progress Note   Assessment/ Plan:   1.  Volume overload/pulmonary edema/ventilator dependent respiratory failure:  Able to tolerate ultrafiltration overnight with CRRT-dobutamine discontinued after data pointing more towards septic shock rather than cardiogenic.    Net negative fluid balance noted-working towards euvolemic state. 2.  End-stage renal disease:  transferred from [facility] after inability to tolerate ultrafiltration with conventional intermittent hemodialysis because of pressor dependent hypotension.  We will continue CRRT at this time for ultrafiltration given her hypotension/dependency on pressors. 3.  Anemia of chronic kidney disease: Status post PRBC on 11/26/2017 and will restart ESA-she is status post intravenous iron at [facility]. 4.  Shock-possibly septic: Remains on Levophed drip with intravenous vancomycin and Zosyn.  Evaluation not indicative of cardiogenic shock and dobutamine was discontinued. 5.  History of atrial fibrillation: On heparin drip, awaiting cardiology evaluation. 6.  History of recent CVA with left-sided hemiplegia  Subjective:   Dobutamine discontinued yesterday, on Levophed drip and tolerating CRRT/ultrafiltration at this time.   Objective:   BP (!) 89/53   Pulse 99   Temp (!) 97.1 F (36.2 C) (Axillary)   Resp (!) 26   Ht 5' 6"  (1.676 m) Comment: Per [facility] clinic documents  Wt 87.2 kg (192 lb 3.9 oz)   SpO2 93%   BMI 31.03 kg/m   Intake/Output Summary (Last 24 hours) at 12/02/2017 0915 Last data filed at 12/02/2017 0900 Gross per 24 hour  Intake 3326.89 ml  Output 4709 ml  Net -1382.11 ml   Weight change: -0.4 kg (-14.1 oz)  Physical Exam: Gen: Comfortable, intubated, opens eyes to calling out her name CVS: Irregularly irregular tachycardia, S1 and S2 normal Resp: Anteriorly clear to auscultation, no rales/rhonchi Abd: Soft,  flat, nontender Ext: Trace-1+ lower extremity/dependent edema  Imaging: Dg Chest Port 1 View  Result Date: 11/30/2017 CLINICAL DATA:  Endotracheal tube repositioning. EXAM: PORTABLE CHEST 1 VIEW COMPARISON:  Chest radiograph performed earlier today at 8:59 p.m. FINDINGS: The patient's endotracheal tube is seen ending 5 cm above the carina. A right-sided dual-lumen catheter is noted ending about the distal SVC. A left IJ line is noted ending about the mid SVC. There is mildly worsened vascular congestion. Bibasilar airspace opacities raise concern for mildly worsening pulmonary edema. Small bilateral pleural effusions are noted. No pneumothorax is seen. The cardiomediastinal silhouette is normal in size. No acute osseous abnormalities are identified. IMPRESSION: 1. Endotracheal tube seen ending 5 cm above the carina. 2. Mildly worsened vascular congestion noted. Bibasilar airspace opacities raise concern for mildly worsening pulmonary edema. Small bilateral pleural effusions noted. Electronically Signed   By: Garald Balding M.D.   On: 11/30/2017 23:42   Dg Chest Port 1 View  Result Date: 11/30/2017 CLINICAL DATA:  Follow-up aeration on ventilation-dependent patient. EXAM: PORTABLE CHEST 1 VIEW COMPARISON:  None. FINDINGS: The patient's endotracheal tube is seen ending 8 cm above the carina. A left IJ line is noted ending about the mid SVC. A right-sided dual-lumen catheter is noted ending about the distal SVC. Small bilateral pleural effusions are noted. Hazy bibasilar airspace opacification may reflect pulmonary edema or possibly pneumonia. No pneumothorax is seen. The cardiomediastinal silhouette is normal in size. No acute osseous abnormalities are identified. IMPRESSION: 1. Endotracheal tube seen ending 8 cm above the carina. 2. Left IJ line noted ending about the mid SVC. 3. Small bilateral pleural effusions noted. Hazy bibasilar airspace opacification may  reflect pulmonary edema or possibly pneumonia.  Electronically Signed   By: Garald Balding M.D.   On: 11/30/2017 21:19    Labs: BMET Recent Labs  Lab 11/30/17 2126 12/01/17 0401 12/01/17 1614 12/02/17 0420  NA 136 136 136 137  K 4.5 4.6 4.5 4.4  CL 97* 97* 98* 98*  CO2 31 30 29 30   GLUCOSE 136* 114* 135* 135*  BUN 54* 47* 30* 23*  CREATININE 2.45* 2.09* 1.46* 1.24*  CALCIUM 9.0 9.1 8.7* 9.1  PHOS 1.9* 1.9* 1.8* 1.9*   CBC Recent Labs  Lab 11/30/17 2126 12/01/17 0401 12/02/17 0420  WBC 12.1* 12.3* 15.7*  NEUTROABS  --   --  13.5*  HGB 8.6* 8.5* 9.1*  HCT 29.2* 29.2* 31.6*  MCV 96.7 96.4 98.8  PLT 175 178 207    Medications:    . chlorhexidine gluconate (MEDLINE KIT)  15 mL Mouth Rinse BID  . Chlorhexidine Gluconate Cloth  6 each Topical Daily  . darbepoetin (ARANESP) injection - NON-DIALYSIS  60 mcg Subcutaneous Q Sat-1800  . feeding supplement (PRO-STAT SUGAR FREE 64)  60 mL Per Tube BID BM  . fentaNYL (SUBLIMAZE) injection  50 mcg Intravenous Once  . insulin aspart  2-6 Units Subcutaneous Q4H  . ipratropium-albuterol  3 mL Nebulization Q6H  . mouth rinse  15 mL Mouth Rinse 10 times per day  . pantoprazole sodium  40 mg Per Tube Daily   Elmarie Shiley, MD 12/02/2017, 9:15 AM

## 2017-12-02 NOTE — Consult Note (Signed)
WOC Nurse wound consult note Reason for Consult: Moisture associated skin damage, specifically intertriginous dermatitis Wound type: moisture with fungal over growth Pressure Injury POA: N/A Measurement:4cm x 0.5cm x 0.2cm Wound bed: red, moist Drainage (amount, consistency, odor) small amount of serous exudate Periwound: macerated Dressing procedure/placement/frequency: Patient is on a therapeutic mattress with low air loss feature and is being turned and repositioned from side to side. There is a foam dressing over the affected area which is allowing moisture to pool in the intertriginous space. I have implemented a POC using a thin ribbon of silver hydrofiber in the open area before placing the foam.    Support adding a few does of systemic antifungal, e.g., Diflucan.  If you agree, please order.  WOC nursing team will not follow, but will remain available to this patient, the nursing and medical teams.  Please re-consult if needed. Thanks, Ladona MowLaurie Shakenya Stoneberg, MSN, RN, GNP, Hans EdenCWOCN, CWON-AP, FAAN  Pager# [phone]

## 2017-12-03 ENCOUNTER — Inpatient Hospital Stay (HOSPITAL_COMMUNITY): Payer: Medicare Other

## 2017-12-03 LAB — RENAL FUNCTION PANEL
ALBUMIN: 2.7 g/dL — AB (ref 3.5–5.0)
ANION GAP: 11 (ref 5–15)
Albumin: 3 g/dL — ABNORMAL LOW (ref 3.5–5.0)
Anion gap: 9 (ref 5–15)
BUN: 22 mg/dL — AB (ref 6–20)
BUN: 24 mg/dL — ABNORMAL HIGH (ref 6–20)
CALCIUM: 8.6 mg/dL — AB (ref 8.9–10.3)
CHLORIDE: 99 mmol/L — AB (ref 101–111)
CO2: 29 mmol/L (ref 22–32)
CO2: 27 mmol/L (ref 22–32)
Calcium: 9.1 mg/dL (ref 8.9–10.3)
Chloride: 98 mmol/L — ABNORMAL LOW (ref 101–111)
Creatinine, Ser: 1.03 mg/dL — ABNORMAL HIGH (ref 0.44–1.00)
Creatinine, Ser: 1.04 mg/dL — ABNORMAL HIGH (ref 0.44–1.00)
GFR calc Af Amer: >60 mL/min (ref >60)
GFR calc non Af Amer: 54 mL/min — ABNORMAL LOW (ref >60)
GFR calc non Af Amer: 53 mL/min — ABNORMAL LOW (ref >60)
GLUCOSE: 176 mg/dL — AB (ref 65–99)
Glucose, Bld: 228 mg/dL — ABNORMAL HIGH (ref 65–99)
PHOSPHORUS: 4.9 mg/dL — AB (ref 2.5–4.6)
POTASSIUM: 4 mmol/L (ref 3.5–5.1)
Phosphorus: 1.2 mg/dL — ABNORMAL LOW (ref 2.5–4.6)
Potassium: 4.4 mmol/L (ref 3.5–5.1)
SODIUM: 136 mmol/L (ref 135–145)
Sodium: 137 mmol/L (ref 135–145)

## 2017-12-03 LAB — CBC WITH DIFFERENTIAL/PLATELET
ABS IMMATURE GRANULOCYTES: 0.1 10*3/uL (ref 0.0–0.1)
BASOS ABS: 0.1 10*3/uL (ref 0.0–0.1)
Basophils Relative: 1 %
Eosinophils Absolute: 0.1 10*3/uL (ref 0.0–0.7)
Eosinophils Relative: 0 %
HEMATOCRIT: 31.8 % — AB (ref 36.0–46.0)
HEMOGLOBIN: 8.7 g/dL — AB (ref 12.0–15.0)
Immature Granulocytes: 1 %
LYMPHS ABS: 0.5 10*3/uL — AB (ref 0.7–4.0)
LYMPHS PCT: 3 %
MCH: 28.1 pg (ref 26.0–34.0)
MCHC: 27.4 g/dL — AB (ref 30.0–36.0)
MCV: 102.6 fL — ABNORMAL HIGH (ref 78.0–100.0)
MONO ABS: 1.7 10*3/uL — AB (ref 0.1–1.0)
Monocytes Relative: 12 %
NEUTROS ABS: 12.3 10*3/uL — AB (ref 1.7–7.7)
Neutrophils Relative %: 83 %
Platelets: 205 10*3/uL (ref 150–400)
RBC: 3.1 MIL/uL — ABNORMAL LOW (ref 3.87–5.11)
RDW: 18.8 % — ABNORMAL HIGH (ref 11.5–15.5)
WBC: 14.9 10*3/uL — ABNORMAL HIGH (ref 4.0–10.5)

## 2017-12-03 LAB — GLUCOSE, CAPILLARY
GLUCOSE-CAPILLARY: 163 mg/dL — AB (ref 65–99)
GLUCOSE-CAPILLARY: 150 mg/dL — AB (ref 65–99)
GLUCOSE-CAPILLARY: 99 mg/dL (ref 65–99)
Glucose-Capillary: 167 mg/dL — ABNORMAL HIGH (ref 65–99)
Glucose-Capillary: 163 mg/dL — ABNORMAL HIGH (ref 65–99)
Glucose-Capillary: 156 mg/dL — ABNORMAL HIGH (ref 65–99)

## 2017-12-03 LAB — PROTIME-INR
INR: 2.68
Prothrombin Time: 28.3 seconds — ABNORMAL HIGH (ref 11.4–15.2)

## 2017-12-03 LAB — MAGNESIUM: MAGNESIUM: 2.4 mg/dL (ref 1.7–2.4)

## 2017-12-03 MED ORDER — SODIUM PHOSPHATES 45 MMOLE/15ML IV SOLN
30.0000 mmol | Freq: Once | INTRAVENOUS | Status: AC
  Administered 2017-12-03: 30 mmol via INTRAVENOUS
  Filled 2017-12-03: qty 10

## 2017-12-03 NOTE — Procedures (Signed)
Admit: 11/30/2017 LOS: 3  64F ESRD admit from LTAC for hypotension, VDRF/Hypervolemia/Pulm Edema, inability to UF with iHD  Current CRRT Prescription: Start Date: 12/01/17 Catheter: R IJ Tunneled HD Cath BFR: 150 Pre Blood Pump: 500 4K DFR: 1500 4K Replacement Rate: 200 4K Goal UF: 127m/hr net engative Anticoagulation: none in circuit, INR > 2 Clotting: infrequent  S: Tol CRRT with net UF 2.2L Net negative yesterday Tol UF [place] on NE gtt Weight down 8lb  O: 06/09 0701 - 06/10 0700 In: 2955 [I.V.:1115; NG/GT:1200; IV Piggyback:350] Out: 5176 [Urine:60]   Chronically Ill Appearing, intubated R IJ Tunneled HD cath noted Nl S1s2, no rub IRIR Coarse BS b/l 1+ LEE  Filed Weights   12/01/17 0530 12/02/17 0500 12/03/17 0500  Weight: 87.5 kg (192 lb 14.4 oz) 87.2 kg (192 lb 3.9 oz) 83.8 kg (184 lb 11.9 oz)    Recent Labs  Lab 12/02/17 0420 12/02/17 1600 12/03/17 0359  NA 137 133* 137  K 4.4 4.1 4.4  CL 98* 97* 99*  CO2 _0 GLUCOSE 135* 225* 176*  BUN 23* 20 22*  CREATININE 1.24* 1.12* 1.03*  CALCIUM 9.1 8.6* 9.1  PHOS 1.9* 2.3* 1.2*   Recent Labs  Lab 12/01/17 0401 12/02/17 0420 12/03/17 0500  WBC 12.3* 15.7* 14.9*  NEUTROABS  --  13.5* 12.3*  HGB 8.5* 9.1* 8.7*  HCT 29.2* 31.6* 31.8*  MCV 96.4 98.8 102.6*  PLT 178 207 205    Scheduled Meds: . chlorhexidine gluconate (MEDLINE KIT)  15 mL Mouth Rinse BID  . Chlorhexidine Gluconate Cloth  6 each Topical Daily  . darbepoetin (ARANESP) injection - NON-DIALYSIS  60 mcg Subcutaneous Q Sat-1800  . feeding supplement (PRO-STAT SUGAR FREE 64)  60 mL Per Tube BID BM  . insulin aspart  2-6 Units Subcutaneous Q4H  . ipratropium-albuterol  3 mL Nebulization Q6H  . mouth rinse  15 mL Mouth Rinse 10 times per day  . midodrine  5 mg Oral TID WC  . pantoprazole sodium  40 mg Per Tube Daily   Continuous Infusions: . sodium chloride 10 mL/hr at 12/03/17 0800  . feeding supplement (VITAL AF 1.2 CAL) 50 mL/hr at  12/03/17 0800  . fentaNYL infusion INTRAVENOUS 175 mcg/hr (12/03/17 0800)  . heparin 999 mL/hr at 11/30/17 2354  . norepinephrine (LEVOPHED) Adult infusion 25 mcg/min (12/03/17 0800)  . piperacillin-tazobactam 3.375 g (12/03/17 0605)  . dialysis replacement fluid (prismasate) 500 mL/hr at 12/03/17 0320  . dialysis replacement fluid (prismasate) 200 mL/hr at 12/03/17 0358  . dialysate (PRISMASATE) 1,500 mL/hr at 12/03/17 0606  . vancomycin Stopped (12/02/17 1724)   PRN Meds:.sodium chloride, docusate, fentaNYL, heparin, heparin, midazolam, midazolam  ABG    Component Value Date/Time   PHART 7.460 (H) 12/01/2017 0355   PCO2ART 41.3 12/01/2017 0355   PO2ART 62.1 (L) 12/01/2017 0355   HCO3 29.0 (H) 12/01/2017 0355   TCO2 31 11/30/2017 2226   O2SAT 64.9 12/01/2017 1300    A 1. ESRD, recent start of HD, has R IJ tunneled HD cath 2. Hypervolemia/Pulmonoary Edema/VDRF 3. Anemia of CKD; recent IV Fe, on aranesp 614m qSat; monitoring 4. Hypotension, ? Septic shock 5. AFib on anticoagulation 6. Hx/o CVA with L sided hemiplegia, PEG tube  7. COPD 8. DM2 9. CKDBMD 10. Hypophosphatemia   P  Replete Phos Today  Cont CRRt at other settings  RyPearson GrippeMD Ca[employer]ell Rubbermaidgr [phone]

## 2017-12-03 NOTE — Progress Notes (Signed)
PULMONARY / CRITICAL CARE MEDICINE   Name: Sheila Cabrera MRN: [id number] DOB: [birth year]    ADMISSION DATE:  11/30/2017 CONSULTATION DATE:  11/30/2017  [facility]ING MD:  [facility]   CHIEF COMPLAINT:  Respiratory Distress   HISTORY OF PRESENT ILLNESS:   72 year old female with PMH of DM, COPD, Diastolic HF, ESRD, CVA, Dysphagia s/p PEG  [facility] 5/6 > Presented with hypoxia/AMS secondary to PNA requiring intubation, transferred to [facility] 5/6. Due to GI bleed Eliquis was held, during this time developed acute bilateral cortical strokes. Previously had Stage 4 CKD however this progressed to ESRD with needs for HD. Extubated (unknown time frame), however due to inability to wean off BiPAP patient was transferred to [facility] on 5/31.    While at [facility] was intermittently off BiPAP for short periods. On 6/7 required intubation for respiratory distress. Due to inability to tolerate HD with bradycardia and hypotension patient was transferred to [facility].   SUBJECTIVE:  No events overnight, no new complaints  VITAL SIGNS: BP 125/69   Pulse 98   Temp 98.4 F (36.9 C) (Oral)   Resp (!) 23   Ht 5\' 6"  (1.676 m) Comment: [facility]  Wt 184 lb 11.9 oz (83.8 kg)   SpO2 100%   BMI 29.82 kg/m   HEMODYNAMICS: CVP:  [11 mmHg] 11 mmHg  VENTILATOR SETTINGS: Vent Mode: PRVC FiO2 (%):  [80 %] 80 % Set Rate:  [26 bmp] 26 bmp Vt Set:  [510 mL] 510 mL PEEP:  [10 cmH20] 10 cmH20 Plateau Pressure:  [19 cmH20-32 cmH20] 28 cmH20  INTAKE / OUTPUT: I/O last 3 completed shifts: In: 4170.2 [I.V.:1630.2; Other:290; NG/GT:1800; IV Piggyback:450] Out: 7690 [Urine:80; Other:7610]  PHYSICAL EXAMINATION: General: Frail elderly female, sedated on the vent HEENT: [place]/AT, PERRL, EOM-I and MMM Neuro: Unresponsive, withdraws to pain but not command CV: RRR, Nl S1/S2 and -M/R/G PULM: Coarse BS diffusely GI: Soft, NT, ND and +BS Extremities: warm/dry, 2+ edema lower extremity  foot drop is noted Skin: no rashes or lesions  LABS:  BMET Recent Labs  Lab 12/02/17 0420 12/02/17 1600 12/03/17 0359  NA 137 133* 137  K 4.4 4.1 4.4  CL 98* 97* 99*  CO2 30 27 29   BUN 23* 20 22*  CREATININE 1.24* 1.12* 1.03*  GLUCOSE 135* 225* 176*   Electrolytes Recent Labs  Lab 11/30/17 2126  12/02/17 0420 12/02/17 1600 12/03/17 0359  CALCIUM 9.0   < > 9.1 8.6* 9.1  MG 2.1  --  2.6*  --  2.4  PHOS 1.9*   < > 1.9* 2.3* 1.2*   < > = values in this interval not displayed.   CBC Recent Labs  Lab 12/01/17 0401 12/02/17 0420 12/03/17 0500  WBC 12.3* 15.7* 14.9*  HGB 8.5* 9.1* 8.7*  HCT 29.2* 31.6* 31.8*  PLT 178 207 205   Coag's Recent Labs  Lab 12/01/17 0401 12/02/17 0420 12/03/17 0500  INR 2.40 2.82 2.68   Sepsis Markers Recent Labs  Lab 11/30/17 2126 11/30/17 2130 12/01/17 0401 12/02/17 0420  LATICACIDVEN  --  1.0  --   --   PROCALCITON 0.70  --  0.59 0.82   ABG Recent Labs  Lab 11/30/17 2226 12/01/17 0355  PHART 7.372 7.460*  PCO2ART 50.6* 41.3  PO2ART 53.0* 62.1*   Liver Enzymes Recent Labs  Lab 12/02/17 0420 12/02/17 1600 12/03/17 0359  ALBUMIN 3.1* 3.0* 3.0*   Cardiac Enzymes Recent Labs  Lab 11/30/17 2126 12/01/17 0401 12/01/17  1302  TROPONINI 0.06* 0.05* 0.05*   Glucose Recent Labs  Lab 12/02/17 1126 12/02/17 1507 12/02/17 1930 12/02/17 2305 12/03/17 0325 12/03/17 0729  GLUCAP 146* 137* 161* 173* 167* 163*   Imaging Dg Chest Port 1 View  Result Date: 12/03/2017 CLINICAL DATA:  72 year old female with respiratory failure, ventilator dependent. EXAM: PORTABLE CHEST 1 VIEW COMPARISON:  12/02/2017 and earlier. FINDINGS: Portable AP view at 0427 hours. Endotracheal tube tip at the level the clavicles. Stable right chest tube catheter and left IJ approach single-lumen catheter. Stable lung volumes. Stable cardiac size and mediastinal contours. Continued confluent retrocardiac opacity on the left, patchy in veiling  opacity at the right lung base. No superimposed pneumothorax. Pulmonary vascularity is stable since yesterday and mildly decreased since 11/30/2017, no overt edema. No areas of worsening ventilation. IMPRESSION: 1. Endotracheal tube tip in satisfactory position at the level the clavicles. Stable bilateral IJ venous catheters. 2. Decreased pulmonary vascular congestion since 11/30/2017 with otherwise stable ventilation. Left lower lobe collapse or consolidation, and probable small right pleural effusion. Electronically Signed   By: Odessa FlemingH  Hall M.D.   On: 12/03/2017 07:57   STUDIES:  CXR 6/7 > The patient's endotracheal tube is seen ending 8 cm above the carina. A left IJ line is noted ending about the mid SVC. A right-sided dual-lumen catheter is noted ending about the distal SVC. Small bilateral pleural effusions are noted. Hazy bibasilar airspace opacification may reflect pulmonary edema or possibly pneumonia. No pneumothorax is seen. The cardiomediastinal silhouette is normal in size. No acute osseous abnormalities are identified.  CULTURES: Blood 6/7 >> Sputum 6/7 >>  ANTIBIOTICS: 12/01/2017 Zosyn>> 12/01/2017 vancomycin>>  SIGNIFICANT EVENTS: 6/7 > [facility]  12/01/2017 started on dobutamine drip 12/01/2017 cardiology consult  LINES/TUBES: CVC Left IJ 6/7 (OSH) >>  ETT 6/6 (OSH) >> Right IJ Tunneled HD >>  PEG >>    DISCUSSION: 72 year old female with prolonged hospitalization secondary to respiratory distress and ESRD, re-intubated on 6/7 and transferred to [facility] for CRRT   ASSESSMENT / PLAN:  PULMONARY A: Acute on Chronic Hypoxic Respiratory Distress in setting of decompensated heart failure with pulmonary edema vs PNA  H/O COPD, 30 year smoking history  P:   Continue full vent support Titrate PEEP and FiO2 for sat of 88-92% Will need to discuss plan of care with family  CARDIOVASCULAR A:  Hypotension in setting of CardioRenal Syndrome vs Sepsis  H/O HTN,  CHF, A.Fib (On Coumadin)  P:  History of atrial fibrillation currently sinus rhythm will be placed back on a heparin drip once her INR is less than 2 Levophed to keep blood pressure systolic greater than 90 Dobutamine off Midodrine as ordered  RENAL A:   ESRD on HD MWF  CRRT due to bradycardia and hypotension P:   Nephrology is following CRRT per renal Replace electrolytes as indicated BMET in AM  GASTROINTESTINAL A:   SUP  Dysphagia s/p PEG  P:   Tube feeds NPO GI Prophylaxis  HEMATOLOGIC Recent Labs    12/02/17 0420 12/03/17 0500  HGB 9.1* 8.7*   Lab Results  Component Value Date   INR 2.68 12/03/2017   INR 2.82 12/02/2017   INR 2.40 12/01/2017   A:   Anemia of Chronic Disease  P:  Transfuse per protocol INR elevated, restart when less than 2  INFECTIOUS A:   Recent CAP  P:   Continue current antibiotics All other support as needed  ENDOCRINE CBG (last 3)  Recent Labs    12/02/17 2305 12/03/17 0325 12/03/17 0729  GLUCAP 173* 167* 163*   A:   H/O DM    P:   Sliding scale insulin as needed  NEUROLOGIC A:   Sedation Needs  CVA with left sided hemiplegia H/O Depression/Anxiety   P:   RA SS 0 to -1 Sedation as needed for management  FAMILY  - Updates: No family bedside to update, will need to discuss plan of care - Inter-disciplinary family meet or Palliative Care meeting due by: 12/07/2017  The patient is critically ill with multiple organ systems failure and requires high complexity decision making for assessment and support, frequent evaluation and titration of therapies, application of advanced monitoring technologies and extensive interpretation of multiple databases.   Critical Care Time devoted to patient care services described in this note is  33  Minutes. This time reflects time of care of this signee Dr Koren Bound. This critical care time does not reflect procedure time, or teaching time or supervisory time of PA/NP/Med  student/Med Resident etc but could involve care discussion time.  Alyson Reedy, M.D. [facility] Pulmonary/Critical Care Medicine. Pager: [phone]. After hours pager: [phone].  12/03/2017, 9:36 AM

## 2017-12-04 ENCOUNTER — Inpatient Hospital Stay (HOSPITAL_COMMUNITY): Payer: Medicare Other

## 2017-12-04 ENCOUNTER — Other Ambulatory Visit: Payer: Self-pay

## 2017-12-04 ENCOUNTER — Encounter (HOSPITAL_COMMUNITY): Payer: Self-pay | Admitting: *Deleted

## 2017-12-04 LAB — POCT I-STAT 3, ART BLOOD GAS (G3+)
ACID-BASE DEFICIT: 2 mmol/L (ref 0.0–2.0)
ACID-BASE DEFICIT: 2 mmol/L (ref 0.0–2.0)
BICARBONATE: 26.4 mmol/L (ref 20.0–28.0)
BICARBONATE: 26.7 mmol/L (ref 20.0–28.0)
O2 SAT: 68 %
O2 Saturation: 68 %
PO2 ART: 42 mmHg — AB (ref 83.0–108.0)
PO2 ART: 43 mmHg — AB (ref 83.0–108.0)
Patient temperature: 99
Patient temperature: 99
TCO2: 28 mmol/L (ref 22–32)
TCO2: 29 mmol/L (ref 22–32)
pCO2 arterial: 59.3 mmHg — ABNORMAL HIGH (ref 32.0–48.0)
pCO2 arterial: 62.6 mmHg — ABNORMAL HIGH (ref 32.0–48.0)
pH, Arterial: 7.257 — ABNORMAL LOW (ref 7.350–7.450)
pH, Arterial: 7.238 — ABNORMAL LOW (ref 7.350–7.450)

## 2017-12-04 LAB — RENAL FUNCTION PANEL
ALBUMIN: 2.9 g/dL — AB (ref 3.5–5.0)
ANION GAP: 8 (ref 5–15)
Albumin: 3 g/dL — ABNORMAL LOW (ref 3.5–5.0)
Anion gap: 12 (ref 5–15)
BUN: 23 mg/dL — ABNORMAL HIGH (ref 6–20)
BUN: 24 mg/dL — AB (ref 6–20)
CALCIUM: 8.8 mg/dL — AB (ref 8.9–10.3)
CHLORIDE: 100 mmol/L — AB (ref 101–111)
CO2: 28 mmol/L (ref 22–32)
CO2: 28 mmol/L (ref 22–32)
CREATININE: 0.88 mg/dL (ref 0.44–1.00)
Calcium: 9 mg/dL (ref 8.9–10.3)
Chloride: 99 mmol/L — ABNORMAL LOW (ref 101–111)
Creatinine, Ser: 1 mg/dL (ref 0.44–1.00)
GFR calc Af Amer: >60 mL/min (ref >60)
GFR calc non Af Amer: 56 mL/min — ABNORMAL LOW (ref >60)
GFR calc non Af Amer: >60 mL/min (ref >60)
GLUCOSE: 263 mg/dL — AB (ref 65–99)
Glucose, Bld: 182 mg/dL — ABNORMAL HIGH (ref 65–99)
PHOSPHORUS: 3.1 mg/dL (ref 2.5–4.6)
Phosphorus: 1.6 mg/dL — ABNORMAL LOW (ref 2.5–4.6)
Potassium: 4.1 mmol/L (ref 3.5–5.1)
Potassium: 3.7 mmol/L (ref 3.5–5.1)
SODIUM: 139 mmol/L (ref 135–145)
Sodium: 136 mmol/L (ref 135–145)

## 2017-12-04 LAB — CBC
HEMATOCRIT: 32.4 % — AB (ref 36.0–46.0)
HEMOGLOBIN: 9 g/dL — AB (ref 12.0–15.0)
MCH: 28.3 pg (ref 26.0–34.0)
MCHC: 27.8 g/dL — ABNORMAL LOW (ref 30.0–36.0)
MCV: 101.9 fL — ABNORMAL HIGH (ref 78.0–100.0)
Platelets: 230 10*3/uL (ref 150–400)
RBC: 3.18 MIL/uL — ABNORMAL LOW (ref 3.87–5.11)
RDW: 18.8 % — ABNORMAL HIGH (ref 11.5–15.5)
WBC: 16.5 10*3/uL — ABNORMAL HIGH (ref 4.0–10.5)

## 2017-12-04 LAB — HEPARIN LEVEL (UNFRACTIONATED): Heparin Unfractionated: 0.92 IU/mL — ABNORMAL HIGH (ref 0.30–0.70)

## 2017-12-04 LAB — GLUCOSE, CAPILLARY
GLUCOSE-CAPILLARY: 157 mg/dL — AB (ref 65–99)
Glucose-Capillary: 169 mg/dL — ABNORMAL HIGH (ref 65–99)
Glucose-Capillary: 148 mg/dL — ABNORMAL HIGH (ref 65–99)
Glucose-Capillary: 225 mg/dL — ABNORMAL HIGH (ref 65–99)
Glucose-Capillary: 192 mg/dL — ABNORMAL HIGH (ref 65–99)
Glucose-Capillary: 132 mg/dL — ABNORMAL HIGH (ref 65–99)

## 2017-12-04 LAB — PROTIME-INR
INR: 1.75
PROTHROMBIN TIME: 20.3 s — AB (ref 11.4–15.2)

## 2017-12-04 LAB — CULTURE, RESPIRATORY W GRAM STAIN

## 2017-12-04 LAB — CULTURE, RESPIRATORY

## 2017-12-04 LAB — MAGNESIUM: Magnesium: 2.5 mg/dL — ABNORMAL HIGH (ref 1.7–2.4)

## 2017-12-04 MED ORDER — VASOPRESSIN 20 UNIT/ML IV SOLN
0.0300 [IU]/min | INTRAVENOUS | Status: DC
  Administered 2017-12-04 – 2017-12-05 (×2): 0.03 [IU]/min via INTRAVENOUS
  Filled 2017-12-04 (×3): qty 2

## 2017-12-04 MED ORDER — DEXMEDETOMIDINE HCL IN NACL 400 MCG/100ML IV SOLN
0.4000 ug/kg/h | INTRAVENOUS | Status: DC
  Administered 2017-12-04: 0.4 ug/kg/h via INTRAVENOUS
  Administered 2017-12-04 (×2): 0.8 ug/kg/h via INTRAVENOUS
  Administered 2017-12-05: 1 ug/kg/h via INTRAVENOUS
  Administered 2017-12-05: 1.2 ug/kg/h via INTRAVENOUS
  Administered 2017-12-05 (×2): 0.8 ug/kg/h via INTRAVENOUS
  Administered 2017-12-06: 1.1 ug/kg/h via INTRAVENOUS
  Administered 2017-12-06: 1.2 ug/kg/h via INTRAVENOUS
  Administered 2017-12-06 (×3): 1.1 ug/kg/h via INTRAVENOUS
  Administered 2017-12-07: 1.2 ug/kg/h via INTRAVENOUS
  Administered 2017-12-07 (×3): 1.1 ug/kg/h via INTRAVENOUS
  Administered 2017-12-08 – 2017-12-11 (×5): 0.4 ug/kg/h via INTRAVENOUS
  Administered 2017-12-11 – 2017-12-12 (×3): 0.8 ug/kg/h via INTRAVENOUS
  Filled 2017-12-04 (×25): qty 100

## 2017-12-04 MED ORDER — FLEET ENEMA 7-19 GM/118ML RE ENEM
1.0000 | ENEMA | Freq: Once | RECTAL | Status: AC
  Administered 2017-12-04: 1 via RECTAL
  Filled 2017-12-04: qty 1

## 2017-12-04 MED ORDER — SODIUM CHLORIDE 0.9 % IV SOLN
1.0000 g | INTRAVENOUS | Status: DC
  Administered 2017-12-04 – 2017-12-07 (×4): 1 g via INTRAVENOUS
  Filled 2017-12-04 (×5): qty 10

## 2017-12-04 MED ORDER — MIDAZOLAM HCL 2 MG/2ML IJ SOLN
4.0000 mg | Freq: Once | INTRAMUSCULAR | Status: AC
  Administered 2017-12-05: 2 mg via INTRAVENOUS
  Filled 2017-12-04: qty 4

## 2017-12-04 MED ORDER — MAGNESIUM HYDROXIDE 400 MG/5ML PO SUSP
15.0000 mL | Freq: Every day | ORAL | Status: DC
  Administered 2017-12-04 – 2017-12-05 (×2): 15 mL
  Filled 2017-12-04 (×4): qty 30

## 2017-12-04 MED ORDER — SODIUM PHOSPHATES 45 MMOLE/15ML IV SOLN
30.0000 mmol | Freq: Once | INTRAVENOUS | Status: AC
  Administered 2017-12-04: 30 mmol via INTRAVENOUS
  Filled 2017-12-04: qty 10

## 2017-12-04 MED ORDER — ALBUTEROL SULFATE (2.5 MG/3ML) 0.083% IN NEBU
INHALATION_SOLUTION | RESPIRATORY_TRACT | Status: AC
  Filled 2017-12-04: qty 3

## 2017-12-04 MED ORDER — HEPARIN (PORCINE) IN NACL 100-0.45 UNIT/ML-% IJ SOLN
950.0000 [IU]/h | INTRAMUSCULAR | Status: DC
  Administered 2017-12-04: 1100 [IU]/h via INTRAVENOUS
  Administered 2017-12-05: 950 [IU]/h via INTRAVENOUS
  Filled 2017-12-04 (×3): qty 250

## 2017-12-04 MED ORDER — ALBUTEROL SULFATE (2.5 MG/3ML) 0.083% IN NEBU
2.5000 mg | INHALATION_SOLUTION | Freq: Four times a day (QID) | RESPIRATORY_TRACT | Status: DC | PRN
  Administered 2017-12-04: 2.5 mg via RESPIRATORY_TRACT

## 2017-12-04 MED ORDER — VECURONIUM BROMIDE 10 MG IV SOLR
10.0000 mg | Freq: Once | INTRAVENOUS | Status: AC
  Administered 2017-12-05: 10 mg via INTRAVENOUS
  Filled 2017-12-04: qty 10

## 2017-12-04 MED ORDER — FENTANYL CITRATE (PF) 100 MCG/2ML IJ SOLN
200.0000 ug | Freq: Once | INTRAMUSCULAR | Status: AC
  Administered 2017-12-05: 100 ug via INTRAVENOUS
  Filled 2017-12-04: qty 4

## 2017-12-04 MED ORDER — IPRATROPIUM-ALBUTEROL 0.5-2.5 (3) MG/3ML IN SOLN: 3.0000 mL | Freq: Four times a day (QID) | RESPIRATORY_TRACT | Status: DC | PRN

## 2017-12-04 MED ORDER — PROPOFOL 500 MG/50ML IV EMUL
5.0000 ug/kg/min | Freq: Once | INTRAVENOUS | Status: DC
  Filled 2017-12-04: qty 50

## 2017-12-04 MED ORDER — ETOMIDATE 2 MG/ML IV SOLN
40.0000 mg | Freq: Once | INTRAVENOUS | Status: AC
  Administered 2017-12-05: 20 mg via INTRAVENOUS
  Filled 2017-12-04: qty 20

## 2017-12-04 NOTE — Procedures (Addendum)
Admit: 11/30/2017 LOS: 4  49F ESRD admit from LTAC for hypotension, VDRF/Hypervolemia/Pulm Edema, inability to UF with iHD  Current CRRT Prescription: Start Date: 12/01/17 Catheter: R IJ Tunneled HD Cath BFR: 150 Pre Blood Pump: 500 4K DFR: 1500 4K Replacement Rate: 200 4K Goal UF: 133m/hr net engative Anticoagulation: none in circuit, systemic heparin Clotting: infrequent  S: Fam mtg today , cont current measures, plan for Trach Tol CRRT with net UF 2.2L Net negative yesterday Tol UF On NE and VP   O: 06/10 0701 - 06/11 0700 In: 3399.5 [I.V.:1199.3; NG/GT:1200; IV Piggyback:600.2] Out: 5619 [Urine:80]   Chronically Ill Appearing, intubated R IJ Tunneled HD cath noted Nl S1s2, no rub IRIR Coarse BS b/l 1+ LEE  Filed Weights   12/02/17 0500 12/03/17 0500 12/04/17 0500  Weight: 87.2 kg (192 lb 3.9 oz) 83.8 kg (184 lb 11.9 oz) 80 kg (176 lb 5.9 oz)    Recent Labs  Lab 12/03/17 0359 12/03/17 1629 12/04/17 0349  NA 137 136 136  K 4.4 4.0 4.1  CL 99* 98* 100*  CO2 _0 GLUCOSE 176* 228* 182*  BUN 22* 24* 23*  CREATININE 1.03* 1.04* 1.00  CALCIUM 9.1 8.6* 9.0  PHOS 1.2* 4.9* 1.6*   Recent Labs  Lab 12/02/17 0420 12/03/17 0500 12/04/17 0349  WBC 15.7* 14.9* 16.5*  NEUTROABS 13.5* 12.3*  --   HGB 9.1* 8.7* 9.0*  HCT 31.6* 31.8* 32.4*  MCV 98.8 102.6* 101.9*  PLT 207 205 230    Scheduled Meds: . albuterol      . chlorhexidine gluconate (MEDLINE KIT)  15 mL Mouth Rinse BID  . Chlorhexidine Gluconate Cloth  6 each Topical Daily  . darbepoetin (ARANESP) injection - NON-DIALYSIS  60 mcg Subcutaneous Q Sat-1800  . feeding supplement (PRO-STAT SUGAR FREE 64)  60 mL Per Tube BID BM  . insulin aspart  2-6 Units Subcutaneous Q4H  . ipratropium-albuterol  3 mL Nebulization Q6H  . magnesium hydroxide  15 mL Per Tube Daily  . mouth rinse  15 mL Mouth Rinse 10 times per day  . midodrine  5 mg Oral TID WC  . pantoprazole sodium  40 mg Per Tube Daily  . sodium  phosphate  1 enema Rectal Once   Continuous Infusions: . sodium chloride 10 mL/hr at 12/04/17 0801  . dexmedetomidine (PRECEDEX) IV infusion 0.4 mcg/kg/hr (12/04/17 1013)  . feeding supplement (VITAL AF 1.2 CAL) 50 mL/hr at 12/04/17 0800  . fentaNYL infusion INTRAVENOUS 250 mcg/hr (12/04/17 0800)  . heparin 1,100 Units/hr (12/04/17 0800)  . heparin 999 mL/hr at 11/30/17 2354  . norepinephrine (LEVOPHED) Adult infusion 24 mcg/min (12/04/17 0803)  . piperacillin-tazobactam Stopped (12/04/17 0600)  . dialysis replacement fluid (prismasate) 500 mL/hr at 12/04/17 0601  . dialysis replacement fluid (prismasate) 200 mL/hr at 12/03/17 0358  . dialysate (PRISMASATE) 1,500 mL/hr at 12/04/17 0931  . vancomycin Stopped (12/03/17 1730)  . vasopressin (PITRESSIN) infusion - *FOR SHOCK* 0.03 Units/min (12/04/17 1013)   PRN Meds:.sodium chloride, albuterol, docusate, fentaNYL, heparin, heparin, midazolam, midazolam  ABG    Component Value Date/Time   PHART 7.238 (L) 12/04/2017 0535   PCO2ART 62.6 (H) 12/04/2017 0535   PO2ART 43.0 (L) 12/04/2017 0535   HCO3 26.7 12/04/2017 0535   TCO2 29 12/04/2017 0535   ACIDBASEDEF 2.0 12/04/2017 0535   O2SAT 68.0 12/04/2017 0535    A 1. ESRD, recent start of HD, has R IJ tunneled HD cath 2. Hypervolemia/Pulmonoary Edema/VDRF;  3. Anemia of  CKD; recent IV Fe, on aranesp 77mg qSat; monitoring 4. Hypotension, ? Septic shock on NE+VP 5. AFib on anticoagulation, systemic heparin, warfarin on hold 6. Hx/o CVA with L sided hemiplegia, PEG tube  7. COPD / VDRF for Trach 6/12 8. DM2 9. CKDBMD 10. Hypophosphatemia   P  Replete Phos again Today  Cont CRRt at other settings  Cont CRRT while on pressors  RPearson Grippe MD [employer] [phone]

## 2017-12-04 NOTE — Progress Notes (Signed)
Pharmacy Antibiotic Note  Shaaron Adlervelyn Doubrava is a 72 y.o. female admitted on 11/30/2017 with pneumonia. Respiratory cultures now growing pan-sensitive proteus.  Plan: -Stop vancomycin/zosyn -Change to ceftriaxone 1g/24h to complete a total of 8 days  Height: 5\' 6"  (167.6 cm)(Per [facility] documents) Weight: 176 lb 5.9 oz (80 kg) IBW/kg (Calculated) : 59.3  Temp (24hrs), Avg:98.4 F (36.9 C), Min:97.2 F (36.2 C), Max:99 F (37.2 C)  Recent Labs  Lab 11/30/17 2126 11/30/17 2130 12/01/17 0401  12/02/17 0420 12/02/17 1600 12/03/17 0359 12/03/17 0500 12/03/17 1629 12/04/17 0349  WBC 12.1*  --  12.3*  --  15.7*  --   --  14.9*  --  16.5*  CREATININE 2.45*  --  2.09*   < > 1.24* 1.12* 1.03*  --  1.04* 1.00  LATICACIDVEN  --  1.0  --   --   --   --   --   --   --   --    < > = values in this interval not displayed.     Vanc 6/8 > 6/11  Zosyn 6/8 > 6/11 Ceftriaxone 6/11 >>  6/7 BCx: ngtd 6/7 MRSA PCR: neg 6/7 resp cx: proteus   Baldemar FridayMasters, Travaughn Vue M 12/04/2017 11:04 AM

## 2017-12-04 NOTE — Progress Notes (Signed)
PULMONARY / CRITICAL CARE MEDICINE   Name: Sheila Cabrera MRN: [id number] DOB: Sep 05, 1945    ADMISSION DATE:  11/30/2017 CONSULTATION DATE:  11/30/2017  [facility]ING MD:  [facility]   CHIEF COMPLAINT:  Respiratory Distress   HISTORY OF PRESENT ILLNESS:   72 year old female with PMH of DM, COPD, Diastolic HF, ESRD, CVA, Dysphagia s/p PEG  [facility] 5/6 > Presented with hypoxia/AMS secondary to PNA requiring intubation, [facility] 5[facility]6. Due to GI bleed Eliquis was held, during this time developed acute bilateral cortical strokes. Previously had Stage 4 CKD however this progressed to ESRD with needs for HD. Extubated (unknown time frame), however due to inability to wean off BiPAP patient was transferred to [facility] on 5/31.    While at [facility] was intermittently off BiPAP for short periods. On 6/7 required intubation for respiratory distress. Due to inability to tolerate HD with bradycardia and hypotension patient was transferred to [facility].   SUBJECTIVE:  Required higher O2 overnight, no new complaints Increase norepi overnight  VITAL SIGNS: BP 111/63   Pulse (!) 103   Temp 98.5 F (36.9 C) (Oral)   Resp (!) 21   Ht 5\' 6"  (1.676 m) Comment: Per [facility] documents  Wt 176 lb 5.9 oz (80 kg)   SpO2 97%   BMI 28.47 kg/m   HEMODYNAMICS:    VENTILATOR SETTINGS: Vent Mode: PRVC FiO2 (%):  [40 %-80 %] 70 % Set Rate:  [26 bmp] 26 bmp Vt Set:  [510 mL] 510 mL PEEP:  [8 cmH20-12 cmH20] 12 cmH20 Plateau Pressure:  [25 cmH20-32 cmH20] 29 cmH20  INTAKE / OUTPUT: I/O last 3 completed shifts: In: 4761.6 [I.V.:1811.4; Other:450; NG/GT:1800; IV Piggyback:700.2] Out: 7928 [Urine:140; Other:7788]  PHYSICAL EXAMINATION: General: Frail elderly female, agitated HEENT: [place]/AT, PERRL, EOM-I and MMM Neuro: Agitated, moving all ext to command CV: RRR, Nl S1/S2 and -M/R/G PULM: Increased secretion, coarse BS diffusely GI: Soft, NT, distended and +BS Extremities:  warm/dry, 2+ edema lower extremity foot drop is noted Skin: no rashes or lesions  LABS:  BMET Recent Labs  Lab 12/03/17 0359 12/03/17 1629 12/04/17 0349  NA 137 136 136  K 4.4 4.0 4.1  CL 99* 98* 100*  CO2 29 27 28   BUN 22* 24* 23*  CREATININE 1.03* 1.04* 1.00  GLUCOSE 176* 228* 182*   Electrolytes Recent Labs  Lab 12/02/17 0420  12/03/17 0359 12/03/17 1629 12/04/17 0349  CALCIUM 9.1   < > 9.1 8.6* 9.0  MG 2.6*  --  2.4  --  2.5*  PHOS 1.9*   < > 1.2* 4.9* 1.6*   < > = values in this interval not displayed.   CBC Recent Labs  Lab 12/02/17 0420 12/03/17 0500 12/04/17 0349  WBC 15.7* 14.9* 16.5*  HGB 9.1* 8.7* 9.0*  HCT 31.6* 31.8* 32.4*  PLT 207 205 230   Coag's Recent Labs  Lab 12/02/17 0420 12/03/17 0500 12/04/17 0349  INR 2.82 2.68 1.75   Sepsis Markers Recent Labs  Lab 11/30/17 2126 11/30/17 2130 12/01/17 0401 12/02/17 0420  LATICACIDVEN  --  1.0  --   --   PROCALCITON 0.70  --  0.59 0.82   ABG Recent Labs  Lab 12/01/17 0355 12/04/17 0521 12/04/17 0535  PHART 7.460* 7.257* 7.238*  PCO2ART 41.3 59.3* 62.6*  PO2ART 62.1* 42.0* 43.0*   Liver Enzymes Recent Labs  Lab 12/03/17 0359 12/03/17 1629 12/04/17 0349  ALBUMIN 3.0* 2.7* 3.0*   Cardiac Enzymes Recent Labs  Lab 11/30/17 2126 12/01/17 0401 12/01/17 1302  TROPONINI 0.06* 0.05* 0.05*   Glucose Recent Labs  Lab 12/03/17 1117 12/03/17 1531 12/03/17 1926 12/03/17 2308 12/04/17 0309 12/04/17 0717  GLUCAP 150* 163* 99 156* 169* 157*   Imaging No results found. STUDIES:  CXR 6/7 > The patient's endotracheal tube is seen ending 8 cm above the carina. A left IJ line is noted ending about the mid SVC. A right-sided dual-lumen catheter is noted ending about the distal SVC. Small bilateral pleural effusions are noted. Hazy bibasilar airspace opacification may reflect pulmonary edema or possibly pneumonia. No pneumothorax is seen. The cardiomediastinal silhouette is normal  in size. No acute osseous abnormalities are identified.  CULTURES: Blood 6/7 >> Sputum 6/7 >>  ANTIBIOTICS: 12/01/2017 Zosyn>> 12/01/2017 vancomycin>>  SIGNIFICANT EVENTS: 6/7 > Transported to [facility]  12/01/2017 started on dobutamine drip 12/01/2017 cardiology consult  LINES/TUBES: CVC Left IJ 6/7 (OSH) >>  ETT 6/6 (OSH) >> Right IJ Tunneled HD >>  PEG >>    DISCUSSION: 72 year old female with prolonged hospitalization secondary to respiratory distress and ESRD, re-intubated on 6/7 and transferred to [facility] for CRRT   ASSESSMENT / PLAN:  PULMONARY A: Acute on Chronic Hypoxic Respiratory Distress in setting of decompensated heart failure with pulmonary edema vs PNA  H/O COPD, 30 year smoking history  P:   Continue full vent support Titrate PEEP and FiO2 for sat of 88-92% Family to arrive in today at 1PM for family meeting Would like to diurese but hemodynamics prevents it  CARDIOVASCULAR A:  Hypotension in setting of CardioRenal Syndrome vs Sepsis  H/O HTN, CHF, A.Fib (On Coumadin)  P:  Restart heparin, paroxysmal a-fib since INR<2 Add vasopressin at shock dose Levophed to keep blood pressure systolic greater than 90 Midodrine as ordered  RENAL A:   ESRD on HD MWF  CRRT due to bradycardia and hypotension P:   Nephrology is following CRRT per renal Replace electrolytes as indicated BMET in AM  GASTROINTESTINAL A:   SUP  Dysphagia s/p PEG  P:   Tube feeds NPO GI Prophylaxis Add fleet enema for constipation MOM added Continue colace  HEMATOLOGIC Recent Labs    12/03/17 0500 12/04/17 0349  HGB 8.7* 9.0*   Lab Results  Component Value Date   INR 1.75 12/04/2017   INR 2.68 12/03/2017   INR 2.82 12/02/2017   A:   Anemia of Chronic Disease  P:  Transfuse per protocol Heparin for a-fib, monitor for bleeding  INFECTIOUS A:   Recent CAP  P:   Continue current antibiotics All other support as needed  ENDOCRINE CBG (last 3)  Recent  Labs    12/03/17 2308 12/04/17 0309 12/04/17 0717  GLUCAP 156* 169* 157*   A:   H/O DM    P:   Sliding scale insulin as needed  NEUROLOGIC A:   Sedation Needs  CVA with left sided hemiplegia H/O Depression/Anxiety   P:   RA SS 0 to -1 Sedation as needed for management Add precedex Fentanyl drip  FAMILY  - Updates: No family bedside to update, family meeting today at 1 PM  - Inter-disciplinary family meet or Palliative Care meeting due by: 12/07/2017  The patient is critically ill with multiple organ systems failure and requires high complexity decision making for assessment and support, frequent evaluation and titration of therapies, application of advanced monitoring technologies and extensive interpretation of multiple databases.   Critical Care Time devoted to patient care services described  in this note is  35  Minutes. This time reflects time of care of this signee Dr Koren Bound. This critical care time does not reflect procedure time, or teaching time or supervisory time of PA/NP/Med student/Med Resident etc but could involve care discussion time.  Alyson Reedy, M.D. [facility] Pulmonary/Critical Care Medicine. Pager: [phone]. After hours pager: [phone].  12/04/2017, 8:40 AM

## 2017-12-04 NOTE — Progress Notes (Signed)
ANTICOAGULATION CONSULT NOTE  Pharmacy Consult for heparin Indication: atrial fibrillation  Patient Measurements: Height: 5\' 6"  (167.6 cm)(Per [facility] documents) Weight: 176 lb 5.9 oz (80 kg) IBW/kg (Calculated) : 59.3 Heparin Dosing Weight: 75.8 kg  Vital Signs: Temp: 97.6 F (36.4 C) (06/11 1541) Temp Source: Oral (06/11 1541) BP: 112/68 (06/11 1900) Pulse Rate: 59 (06/11 1900)  Labs: Recent Labs    12/02/17 0420  12/03/17 0359 12/03/17 0500 12/03/17 1629 12/04/17 0349 12/04/17 1800  HGB 9.1*  --   --  8.7*  --  9.0*  --   HCT 31.6*  --   --  31.8*  --  32.4*  --   PLT 207  --   --  205  --  230  --   LABPROT 29.5*  --   --  28.3*  --  20.3*  --   INR 2.82  --   --  2.68  --  1.75  --   HEPARINUNFRC  --   --   --   --   --   --  0.92*  CREATININE 1.24*   < > 1.03*  --  1.04* 1.00  --    < > = values in this interval not displayed.     Assessment: 72 yo female previously on Eliquis and was recently transitioned to warfarin - as documented in shadow chart. INR remained therapeutic for several days without receiving any anticoagulation. Today INR is down to 1.75; therefore will initiate heparin until warfarin can be resumed.   Initial heparin level = 0.92   Goal of Therapy:  Heparin level 0.3-0.7 units/ml Monitor platelets by anticoagulation protocol: Yes     Plan:  -Decrease heparin to 950 units / hr -Daily HL, CBC -Check 8 hour level -F/u plans for resuming warfarin   Elwin Sleightowell, Kriston Pasquarello Kay 12/04/2017,7:23 PM

## 2017-12-04 NOTE — Progress Notes (Signed)
ANTICOAGULATION CONSULT NOTE - Initial Consult  Pharmacy Consult for heparin Indication: atrial fibrillation  Patient Measurements: Height: 5\' 6"  (167.6 cm)(Per [facility] documents) Weight: 176 lb 5.9 oz (80 kg) IBW/kg (Calculated) : 59.3 Heparin Dosing Weight: 75.8 kg  Vital Signs: Temp: 99 F (37.2 C) (06/11 0346) Temp Source: Oral (06/11 0346) BP: 89/66 (06/11 0715) Pulse Rate: 99 (06/11 0700)  Labs: Recent Labs    12/01/17 1302  12/02/17 0420  12/03/17 0359 12/03/17 0500 12/03/17 1629 12/04/17 0349  HGB  --    < > 9.1*  --   --  8.7*  --  9.0*  HCT  --   --  31.6*  --   --  31.8*  --  32.4*  PLT  --   --  207  --   --  205  --  230  LABPROT  --   --  29.5*  --   --  28.3*  --  20.3*  INR  --   --  2.82  --   --  2.68  --  1.75  CREATININE  --    < > 1.24*   < > 1.03*  --  1.04* 1.00  TROPONINI 0.05*  --   --   --   --   --   --   --    < > = values in this interval not displayed.     Assessment: 72 yo female previously on Eliquis and was recently transitioned to warfarin - as documented in shadow chart. INR remained therapeutic for several days without receiving any anticoagulation. Today INR is down to 1.75; therefore will initiate heparin until warfarin can be resumed.    Goal of Therapy:  Heparin level 0.3-0.7 units/ml Monitor platelets by anticoagulation protocol: Yes     Plan:  -Heparin infusion at 1100 units/hr -Daily HL, CBC -Check 8 hour level -F/u plans for resuming warfarin   Baldemar FridayMasters, Jakiya Bookbinder M 12/04/2017,7:20 AM

## 2017-12-04 NOTE — Progress Notes (Signed)
eLink Physician-Brief Progress Note Patient Name: Sheila Cabrera DOB: 1945-10-23 MRN: [id number]   Date of Service  12/04/2017  HPI/Events of Note  Hypoxia - ABG = 7..23/62.6/43.4/29 ? Respiratory therapy increase FiO2 to 80%. O2 Sat now = 100% by Oximeter. CXR this AM looks like pulmonary vascular congestion. Patient on CRRRT.  eICU Interventions  Will order: 1. Increase PEEP to 12.  2. Will ask nurse to notify renal to see if increased fluid pull is possible on CRRT.      Intervention Category Major Interventions: Hypoxemia - evaluation and management  Sommer,Steven Eugene 12/04/2017, 5:49 AM

## 2017-12-05 ENCOUNTER — Inpatient Hospital Stay (HOSPITAL_COMMUNITY): Payer: Medicare Other

## 2017-12-05 LAB — BLOOD GAS, ARTERIAL
ACID-BASE EXCESS: 0.1 mmol/L (ref 0.0–2.0)
Bicarbonate: 25.2 mmol/L (ref 20.0–28.0)
DRAWN BY: 50222
FIO2: 50
MECHVT: 510 mL
O2 Saturation: 98.3 %
PEEP: 12 cmH2O
Patient temperature: 98.6
RATE: 26 resp/min
pCO2 arterial: 48.5 mmHg — ABNORMAL HIGH (ref 32.0–48.0)
pH, Arterial: 7.337 — ABNORMAL LOW (ref 7.350–7.450)
pO2, Arterial: 104 mmHg (ref 83.0–108.0)

## 2017-12-05 LAB — GLUCOSE, CAPILLARY
GLUCOSE-CAPILLARY: 168 mg/dL — AB (ref 65–99)
Glucose-Capillary: 239 mg/dL — ABNORMAL HIGH (ref 65–99)
Glucose-Capillary: 161 mg/dL — ABNORMAL HIGH (ref 65–99)
Glucose-Capillary: 271 mg/dL — ABNORMAL HIGH (ref 65–99)
Glucose-Capillary: 149 mg/dL — ABNORMAL HIGH (ref 65–99)

## 2017-12-05 LAB — CULTURE, BLOOD (ROUTINE X 2)
Culture: NO GROWTH
Culture: NO GROWTH
Special Requests: ADEQUATE
Special Requests: ADEQUATE

## 2017-12-05 LAB — RENAL FUNCTION PANEL
ANION GAP: 8 (ref 5–15)
Albumin: 2.9 g/dL — ABNORMAL LOW (ref 3.5–5.0)
Albumin: 2.7 g/dL — ABNORMAL LOW (ref 3.5–5.0)
Anion gap: 18 — ABNORMAL HIGH (ref 5–15)
BUN: 24 mg/dL — ABNORMAL HIGH (ref 6–20)
BUN: 26 mg/dL — ABNORMAL HIGH (ref 6–20)
CHLORIDE: 102 mmol/L (ref 101–111)
CO2: 28 mmol/L (ref 22–32)
CO2: 25 mmol/L (ref 22–32)
CREATININE: 0.85 mg/dL (ref 0.44–1.00)
CREATININE: 0.83 mg/dL (ref 0.44–1.00)
Calcium: 9 mg/dL (ref 8.9–10.3)
Calcium: 8.5 mg/dL — ABNORMAL LOW (ref 8.9–10.3)
Chloride: 97 mmol/L — ABNORMAL LOW (ref 101–111)
GFR calc non Af Amer: >60 mL/min (ref >60)
Glucose, Bld: 228 mg/dL — ABNORMAL HIGH (ref 65–99)
Glucose, Bld: 298 mg/dL — ABNORMAL HIGH (ref 65–99)
POTASSIUM: 3.9 mmol/L (ref 3.5–5.1)
Phosphorus: 2 mg/dL — ABNORMAL LOW (ref 2.5–4.6)
Phosphorus: 15.9 mg/dL — ABNORMAL HIGH (ref 2.5–4.6)
Potassium: 3.5 mmol/L (ref 3.5–5.1)
SODIUM: 140 mmol/L (ref 135–145)
Sodium: 138 mmol/L (ref 135–145)

## 2017-12-05 LAB — BASIC METABOLIC PANEL
Anion gap: 8 (ref 5–15)
BUN: 24 mg/dL — AB (ref 6–20)
CO2: 28 mmol/L (ref 22–32)
Calcium: 9 mg/dL (ref 8.9–10.3)
Chloride: 102 mmol/L (ref 101–111)
Creatinine, Ser: 0.8 mg/dL (ref 0.44–1.00)
Glucose, Bld: 227 mg/dL — ABNORMAL HIGH (ref 65–99)
POTASSIUM: 3.9 mmol/L (ref 3.5–5.1)
SODIUM: 138 mmol/L (ref 135–145)

## 2017-12-05 LAB — PROTIME-INR
INR: 1.52
PROTHROMBIN TIME: 18.2 s — AB (ref 11.4–15.2)

## 2017-12-05 LAB — CBC
HCT: 30.8 % — ABNORMAL LOW (ref 36.0–46.0)
Hemoglobin: 8.7 g/dL — ABNORMAL LOW (ref 12.0–15.0)
MCH: 28.4 pg (ref 26.0–34.0)
MCHC: 28.2 g/dL — ABNORMAL LOW (ref 30.0–36.0)
MCV: 100.7 fL — ABNORMAL HIGH (ref 78.0–100.0)
PLATELETS: 244 10*3/uL (ref 150–400)
RBC: 3.06 MIL/uL — AB (ref 3.87–5.11)
RDW: 19.1 % — ABNORMAL HIGH (ref 11.5–15.5)
WBC: 15.4 10*3/uL — ABNORMAL HIGH (ref 4.0–10.5)

## 2017-12-05 LAB — HEPARIN LEVEL (UNFRACTIONATED): HEPARIN UNFRACTIONATED: 0.34 [IU]/mL (ref 0.30–0.70)

## 2017-12-05 LAB — PHOSPHORUS: PHOSPHORUS: 2 mg/dL — AB (ref 2.5–4.6)

## 2017-12-05 LAB — MAGNESIUM: MAGNESIUM: 2.3 mg/dL (ref 1.7–2.4)

## 2017-12-05 MED ORDER — SODIUM CHLORIDE 0.9 % IV SOLN
20.0000 ug | Freq: Once | INTRAVENOUS | Status: AC
  Administered 2017-12-05: 20 ug via INTRAVENOUS
  Filled 2017-12-05: qty 5

## 2017-12-05 MED ORDER — VITAL AF 1.2 CAL PO LIQD
1000.0000 mL | ORAL | Status: DC
Start: 2017-12-05 — End: 2017-12-12
  Administered 2017-12-05 – 2017-12-11 (×7): 1000 mL
  Filled 2017-12-05 (×9): qty 1000

## 2017-12-05 MED ORDER — SODIUM PHOSPHATES 45 MMOLE/15ML IV SOLN
30.0000 mmol | Freq: Once | INTRAVENOUS | Status: AC
  Administered 2017-12-05: 30 mmol via INTRAVENOUS
  Filled 2017-12-05: qty 10

## 2017-12-05 MED ORDER — HEPARIN (PORCINE) IN NACL 100-0.45 UNIT/ML-% IJ SOLN
1100.0000 [IU]/h | INTRAMUSCULAR | Status: DC
  Administered 2017-12-05 – 2017-12-06 (×2): 950 [IU]/h via INTRAVENOUS
  Administered 2017-12-08 – 2017-12-09 (×2): 1050 [IU]/h via INTRAVENOUS
  Administered 2017-12-10: 950 [IU]/h via INTRAVENOUS
  Administered 2017-12-11: 1000 [IU]/h via INTRAVENOUS
  Filled 2017-12-05 (×5): qty 250

## 2017-12-05 MED ORDER — PRO-STAT SUGAR FREE PO LIQD
30.0000 mL | Freq: Every day | ORAL | Status: DC
  Administered 2017-12-05 – 2017-12-12 (×36): 30 mL
  Filled 2017-12-05 (×34): qty 30

## 2017-12-05 MED ORDER — MAGNESIUM HYDROXIDE 400 MG/5ML PO SUSP: 15.0000 mL | Freq: Every day | ORAL | Status: DC | PRN

## 2017-12-05 NOTE — Progress Notes (Signed)
eLink Physician-Brief Progress Note Patient Name: Shaaron Adlervelyn Voges DOB: 1945-11-25 MRN: [id number]   Date of Service  12/05/2017  HPI/Events of Note  Liquid Stools X 3 - Patient has wound on buttocks. Request for Flexiseal.   eICU Interventions  Will order Flexiseal.      Intervention Category Major Interventions: Other:  Nataleigh Griffin Dennard Nipugene 12/05/2017, 5:08 AM

## 2017-12-05 NOTE — Progress Notes (Signed)
ANTICOAGULATION CONSULT NOTE  Pharmacy Consult for heparin Indication: atrial fibrillation  Patient Measurements: Height: 5\' 6"  (167.6 cm)([facility]) Weight: 173 lb 4.5 oz (78.6 kg) IBW/kg (Calculated) : 59.3 Heparin Dosing Weight: 75.8 kg  Vital Signs: Temp: 97.6 F (36.4 C) (06/12 0700) Temp Source: Axillary (06/12 0700) BP: 100/59 (06/12 0700) Pulse Rate: 76 (06/12 0738)  Labs: Recent Labs    12/03/17 0500  12/04/17 0349 12/04/17 1701 12/04/17 1800 12/05/17 0434  HGB 8.7*  --  9.0*  --   --  8.7*  HCT 31.8*  --  32.4*  --   --  30.8*  PLT 205  --  230  --   --  244  LABPROT 28.3*  --  20.3*  --   --  18.2*  INR 2.68  --  1.75  --   --  1.52  HEPARINUNFRC  --   --   --   --  0.92* 0.34  CREATININE  --    < > 1.00 0.88  --  0.80  0.85   < > = values in this interval not displayed.     Assessment: [age] yo female previously on Eliquis and was recently transitioned to warfarin - as documented in shadow chart. INR remained therapeutic for several days without receiving any anticoagulation. Heparin was initiated when INR fell < 2. Heparin level this morning is therapeutic. Hgb 8.7, plts wnl.   Goal of Therapy:  Heparin level 0.3-0.7 units/ml Monitor platelets by anticoagulation protocol: Yes     Plan:  -Continue heparin at 950 units/hr -Daily HL, CBC -Check 8 hour level -F/u plans for resuming warfarin   Baldemar FridayMasters, Shalea Tomczak M 12/05/2017,7:42 AM

## 2017-12-05 NOTE — Procedures (Addendum)
Bronchoscopy Procedure Note Shaaron Adlervelyn Gibbard [id number] Oct 14, 1945  Procedure: Bronchoscopy Indications: Tracheostomy placement  Procedure Details Consent: Risks of procedure as well as the alternatives and risks of each were explained to the (patient/caregiver).  Consent for procedure obtained.   Time Out: Verified patient identification, verified procedure, site/side was marked, verified correct patient position, special equipment/implants available, medications/allergies/relevent history reviewed, required imaging and test results available.  Performed  In preparation for procedure, patient was given 100% FiO2 and bronchoscope lubricated. Sedation: versed, fentanyl, etomidate, vecuronium   Airway entered and the airway was examined to the level of the carina.   Procedures performed: Brushings performed Bronchoscope removed.    Evaluation Hemodynamic Status: BP stable throughout; O2 sats: stable throughout Patient's Current Condition: stable Specimens:  none Complications: No apparent complications Patient did tolerate procedure well.   Procedure performed under direct supervision of Dr. Molli KnockYacoub.   Canary BrimBrandi Deitrich Steve, NP-C [facility] Pgr: [phone] or if no answer [phone] 12/05/2017, 3:08 PM

## 2017-12-05 NOTE — Procedures (Signed)
Bedside Tracheostomy Insertion Procedure Note   Patient Details:   Name: Sheila Cabrera DOB: August 13, 1945 MRN: [id number]  Procedure: Tracheostomy  Pre Procedure Assessment: ET Tube Size: 7.0 ET Tube secured at lip (cm): 26 Bite block in place: No Breath Sounds: Clear  Post Procedure Assessment: BP (!) 141/66   Pulse 71   Temp 98 F (36.7 C) (Axillary)   Resp (!) 23   Ht 5\' 6"  (1.676 m) Comment: [facility]  Wt 173 lb 4.5 oz (78.6 kg)   SpO2 100%   BMI 27.97 kg/m  O2 sats: stable throughout Complications: No apparent complications Patient did tolerate procedure well Tracheostomy Brand:Shiley Tracheostomy Style:Cuffed Tracheostomy Size: 6 Tracheostomy Secured WUJ:[id number]:Sutures, velcro Tracheostomy Placement Confirmation:Trach cuff visualized and in place and Chest X ray ordered for placement    Jacqulynn CadetHopper, Latanza Pfefferkorn David 12/05/2017, 3:59 PM

## 2017-12-05 NOTE — Procedures (Signed)
Admit: 11/30/2017 LOS: 5  67F ESRD admit from LTAC for hypotension, VDRF/Hypervolemia/Pulm Edema, inability to UF with iHD  Current CRRT Prescription: Start Date: 12/01/17 Catheter: R IJ Tunneled HD Cath BFR: 150 Pre Blood Pump: 500 4K DFR: 1500 4K Replacement Rate: 200 4K Goal UF: 183m/hr net engative Anticoagulation: none in circuit, systemic heparin Clotting: infrequent  S: For Trach today Remains on Pressors Tolerating UF   O: 06/11 0701 - 06/12 0700 In: 2794.6 [I.V.:1350.6; NG/GT:750; IV Piggyback:401] Out: 4890    Chronically Ill Appearing, intubated R IJ Tunneled HD cath noted Nl S1s2, no rub IRIR Coarse BS b/l 1+ LEE  Filed Weights   12/03/17 0500 12/04/17 0500 12/05/17 0530  Weight: 83.8 kg (184 lb 11.9 oz) 80 kg (176 lb 5.9 oz) 78.6 kg (173 lb 4.5 oz)    Recent Labs  Lab 12/04/17 0349 12/04/17 1701 12/05/17 0434  NA 136 139 138  138  K 4.1 3.7 3.9  3.9  CL 100* 99* 102  102  CO2 _0 GLUCOSE 182* 263* 227*  228*  BUN 23* 24* 24*  24*  CREATININE 1.00 0.88 0.80  0.85  CALCIUM 9.0 8.8* 9.0  9.0  PHOS 1.6* 3.1 2.0*  2.0*   Recent Labs  Lab 12/02/17 0420 12/03/17 0500 12/04/17 0349 12/05/17 0434  WBC 15.7* 14.9* 16.5* 15.4*  NEUTROABS 13.5* 12.3*  --   --   HGB 9.1* 8.7* 9.0* 8.7*  HCT 31.6* 31.8* 32.4* 30.8*  MCV 98.8 102.6* 101.9* 100.7*  PLT 207 205 230 244    Scheduled Meds: . chlorhexidine gluconate (MEDLINE KIT)  15 mL Mouth Rinse BID  . Chlorhexidine Gluconate Cloth  6 each Topical Daily  . darbepoetin (ARANESP) injection - NON-DIALYSIS  60 mcg Subcutaneous Q Sat-1800  . etomidate  40 mg Intravenous Once  . feeding supplement (PRO-STAT SUGAR FREE 64)  60 mL Per Tube BID BM  . fentaNYL (SUBLIMAZE) injection  200 mcg Intravenous Once  . insulin aspart  2-6 Units Subcutaneous Q4H  . ipratropium-albuterol  3 mL Nebulization Q6H  . magnesium hydroxide  15 mL Per Tube Daily  . mouth rinse  15 mL Mouth Rinse 10 times  per day  . midazolam  4 mg Intravenous Once  . midodrine  5 mg Oral TID WC  . pantoprazole sodium  40 mg Per Tube Daily  . vecuronium  10 mg Intravenous Once   Continuous Infusions: . sodium chloride 10 mL/hr at 12/05/17 0700  . cefTRIAXone (ROCEPHIN)  IV Stopped (12/04/17 1330)  . desmopressin (DDAVP) IV    . dexmedetomidine (PRECEDEX) IV infusion 0.8 mcg/kg/hr (12/05/17 0700)  . feeding supplement (VITAL AF 1.2 CAL) Stopped (12/05/17 0400)  . fentaNYL infusion INTRAVENOUS Stopped (12/04/17 1600)  . heparin Stopped (12/05/17 0910)  . heparin 999 mL/hr at 11/30/17 2354  . norepinephrine (LEVOPHED) Adult infusion 4 mcg/min (12/05/17 0910)  . dialysis replacement fluid (prismasate) 500 mL/hr at 12/05/17 0639  . dialysis replacement fluid (prismasate) 200 mL/hr at 12/05/17 0730  . dialysate (PRISMASATE) 1,500 mL/hr at 12/05/17 0937  . propofol    . sodium phosphate  Dextrose 5% IVPB 30 mmol (12/05/17 0931)  . vasopressin (PITRESSIN) infusion - *FOR SHOCK* 0.03 Units/min (12/05/17 0937)   PRN Meds:.sodium chloride, albuterol, docusate, fentaNYL, heparin, heparin, midazolam, midazolam  ABG    Component Value Date/Time   PHART 7.337 (L) 12/05/2017 0510   PCO2ART 48.5 (H) 12/05/2017 0510   PO2ART 104 12/05/2017 0510  HCO3 25.2 12/05/2017 0510   TCO2 29 12/04/2017 0535   ACIDBASEDEF 2.0 12/04/2017 0535   O2SAT 98.3 12/05/2017 0510    A 1. ESRD, recent start of HD, has R IJ tunneled HD cath 2. Hypervolemia/Pulmonoary Edema/VDRF/COPD; for trach 6/12 3. Anemia of CKD; recent IV Fe, on aranesp 95mg qSat; monitoring 4. Hypotension, ? Septic shock on NE+VP 5. AFib on anticoagulation, systemic heparin, warfarin on hold 6. Hx/o CVA with L sided hemiplegia, PEG tube  7. DM2 8. CKDBMD 9. Hypophosphatemia   P  Replete Phos again Today  Cont CRRt at other settings  Cont CRRT while on pressors Medication Issues: Preferred narcotic agents for pain control are hydromorphone,  fentanyl, and methadone. Morphine should not be used. Oxydone is not preferred.  Baclofen should not be used. Avoid Fleets and Magnesium Citrate Bowel Preps   RPearson Grippe MD [facility] pgr [phone]

## 2017-12-05 NOTE — Progress Notes (Signed)
PULMONARY / CRITICAL CARE MEDICINE   Name: Sheila Cabrera MRN: [id number] DOB: [birth year]    ADMISSION DATE:  11/30/2017 CONSULTATION DATE:  11/30/2017  [facility]ING MD:  [facility]   CHIEF COMPLAINT:  Respiratory Distress   HISTORY OF PRESENT ILLNESS:   72 year old female with PMH of DM, COPD, Diastolic HF, ESRD, CVA, Dysphagia s/p PEG  [facility] 5/6 > Presented with hypoxia/AMS secondary to PNA requiring intubation, [facility]. Due to GI bleed Eliquis was held, during this time developed acute bilateral cortical strokes. Previously had Stage 4 CKD however this progressed to ESRD with needs for HD. Extubated (unknown time frame), however due to inability to wean off BiPAP patient was transferred to [facility] on 5/31.    While at [facility] was intermittently off BiPAP for short periods. On 6/7 required intubation for respiratory distress. Due to inability to tolerate HD with bradycardia and hypotension patient [facility]   SUBJECTIVE:  Diarrhea overnight requiring flexiseal placement   VITAL SIGNS: BP (!) 113/59   Pulse 74   Temp 97.6 F (36.4 C) (Axillary)   Resp (!) 27   Ht 5\' 6"  (1.676 m) Comment: [facility]  Wt 173 lb 4.5 oz (78.6 kg)   SpO2 100%   BMI 27.97 kg/m   HEMODYNAMICS:    VENTILATOR SETTINGS: Vent Mode: PRVC FiO2 (%):  [50 %-60 %] 50 % Set Rate:  [26 bmp] 26 bmp Vt Set:  [510 mL] 510 mL PEEP:  [12 cmH20] 12 cmH20 Plateau Pressure:  [21 cmH20-30 cmH20] 21 cmH20  INTAKE / OUTPUT: I/O last 3 completed shifts: In: 4211.6 [I.V.:2037.6; Other:323; NG/GT:1350; IV Piggyback:501] Out: 7607 [Urine:80; Other:7527]  PHYSICAL EXAMINATION: General: Frail, cachectic elderly female, NAD HEENT: /AT, PERRL, EOM-I and MMM Neuro: Sedate this AM, not following commands CV: RRR, Nl S1/S2 and -M/R/G PULM: Coarse BS diffusely GI: Soft, NT, ND and +BS Extremities: -edema and -tenderness Skin: no rashes or  lesions  LABS:  BMET Recent Labs  Lab 12/04/17 0349 12/04/17 1701 12/05/17 0434  NA 136 139 138  138  K 4.1 3.7 3.9  3.9  CL 100* 99* 102  102  CO2 28 28 28  28   BUN 23* 24* 24*  24*  CREATININE 1.00 0.88 0.80  0.85  GLUCOSE 182* 263* 227*  228*   Electrolytes Recent Labs  Lab 12/03/17 0359  12/04/17 0349 12/04/17 1701 12/05/17 0434  CALCIUM 9.1   < > 9.0 8.8* 9.0  9.0  MG 2.4  --  2.5*  --  2.3  PHOS 1.2*   < > 1.6* 3.1 2.0*  2.0*   < > = values in this interval not displayed.   CBC Recent Labs  Lab 12/03/17 0500 12/04/17 0349 12/05/17 0434  WBC 14.9* 16.5* 15.4*  HGB 8.7* 9.0* 8.7*  HCT 31.8* 32.4* 30.8*  PLT 205 230 244   Coag's Recent Labs  Lab 12/03/17 0500 12/04/17 0349 12/05/17 0434  INR 2.68 1.75 1.52   Sepsis Markers Recent Labs  Lab 11/30/17 2126 11/30/17 2130 12/01/17 0401 12/02/17 0420  LATICACIDVEN  --  1.0  --   --   PROCALCITON 0.70  --  0.59 0.82   ABG Recent Labs  Lab 12/04/17 0521 12/04/17 0535 12/05/17 0510  PHART 7.257* 7.238* 7.337*  PCO2ART 59.3* 62.6* 48.5*  PO2ART 42.0* 43.0* 104   Liver Enzymes Recent Labs  Lab 12/04/17 0349 12/04/17 1701 12/05/17 0434  ALBUMIN 3.0* 2.9* 2.9*  Cardiac Enzymes Recent Labs  Lab 11/30/17 2126 12/01/17 0401 12/01/17 1302  TROPONINI 0.06* 0.05* 0.05*   Glucose Recent Labs  Lab 12/04/17 1123 12/04/17 1539 12/04/17 1914 12/04/17 2314 12/05/17 0321 12/05/17 0710  GLUCAP 148* 225* 192* 132* 168* 239*   Imaging Dg Chest Port 1 View  Result Date: 12/05/2017 CLINICAL DATA:  Hypoxia EXAM: PORTABLE CHEST 1 VIEW COMPARISON:  December 04, 2017 FINDINGS: Endotracheal tube tip is 7.2 cm above the carina. Right central catheter tip is in the superior vena cava. Left central catheter tip is at the cavoatrial junction. No pneumothorax. There is a small left pleural effusion. There is mild bibasilar atelectasis. No consolidation. Heart is borderline enlarged with pulmonary  vascularity normal. No adenopathy. There is aortic atherosclerosis. No bone lesions. IMPRESSION: Tube and catheter positions as described without pneumothorax. Small left pleural effusion with mild left base atelectasis. No consolidation. Stable cardiac prominence. There is aortic atherosclerosis. Aortic Atherosclerosis (ICD10-I70.0). Electronically Signed   By: Bretta Bang III M.D.   On: 12/05/2017 07:03   STUDIES:  CXR 6/7 > The patient's endotracheal tube is seen ending 8 cm above the carina. A left IJ line is noted ending about the mid SVC. A right-sided dual-lumen catheter is noted ending about the distal SVC. Small bilateral pleural effusions are noted. Hazy bibasilar airspace opacification may reflect pulmonary edema or possibly pneumonia. No pneumothorax is seen. The cardiomediastinal silhouette is normal in size. No acute osseous abnormalities are identified.  CULTURES: Blood 6/7 >> Sputum 6/7 >>  ANTIBIOTICS: 12/01/2017 Zosyn>> 12/01/2017 vancomycin>>  SIGNIFICANT EVENTS: 6/7 > Transported to [facility]  12/01/2017 started on dobutamine drip 12/01/2017 cardiology consult  LINES/TUBES: CVC Left IJ 6/7 (OSH) >>  ETT 6/6 (OSH) >> Right IJ Tunneled HD >>  PEG >>    DISCUSSION: 72 year old female with prolonged hospitalization secondary to respiratory distress and ESRD, re-intubated on 6/7 and transferred to [facility] for CRRT   ASSESSMENT / PLAN:  PULMONARY A: Acute on Chronic Hypoxic Respiratory Distress in setting of decompensated heart failure with pulmonary edema vs PNA  H/O COPD, 30 year smoking history  P:   Continue full vent support, decrease PEEP to 10 Trach today Titrate PEEP and FiO2 for sat of 88-92% Son who is DPOA wants trach placement CRRT driven diureses  CARDIOVASCULAR A:  Hypotension in setting of CardioRenal Syndrome vs Sepsis  H/O HTN, CHF, A.Fib (On Coumadin)  P:  Hold heparin for trach today, will restart 4 hours post trach Continue  vasopressin for shock Levophed to keep blood pressure systolic greater than 90 Midodrine as ordered Tele monitoring  RENAL A:   ESRD on HD MWF  CRRT due to bradycardia and hypotension P:   Nephrology is following CRRT per renal Replace electrolytes as indicated BMET in AM  GASTROINTESTINAL A:   SUP  Dysphagia s/p PEG  P:   Tube feeds, hold for trach later on today NPO GI Prophylaxis Hold MOM and colace for diarrhea  HEMATOLOGIC Recent Labs    12/04/17 0349 12/05/17 0434  HGB 9.0* 8.7*   Lab Results  Component Value Date   INR 1.52 12/05/2017   INR 1.75 12/04/2017   INR 2.68 12/03/2017   A:   Anemia of Chronic Disease  P:  Transfuse per protocol Heparin for a-fib, monitor for bleeding  INFECTIOUS A:   Recent CAP  P:   Continue current antibiotics All other support as needed  ENDOCRINE CBG (last 3)  Recent Labs  12/04/17 2314 12/05/17 0321 12/05/17 0710  GLUCAP 132* 168* 239*   A:   H/O DM    P:   Sliding scale insulin as needed  NEUROLOGIC A:   Sedation Needs  CVA with left sided hemiplegia H/O Depression/Anxiety   P:   RA SS 0 to -1 Sedation as needed for management Add precedex Fentanyl drip  FAMILY  - Updates: No family bedside today, plan on trach placement today.  - Inter-disciplinary family meet or Palliative Care meeting due by: 12/07/2017  The patient is critically ill with multiple organ systems failure and requires high complexity decision making for assessment and support, frequent evaluation and titration of therapies, application of advanced monitoring technologies and extensive interpretation of multiple databases.   Critical Care Time devoted to patient care services described in this note is  35  Minutes. This time reflects time of care of this signee Dr Koren Bound. This critical care time does not reflect procedure time, or teaching time or supervisory time of PA/NP/Med student/Med Resident etc but could involve  care discussion time.  Alyson Reedy, M.D. [facility] Pulmonary/Critical Care Medicine. Pager: [phone]. After hours pager: [phone].  12/05/2017, 9:10 AM

## 2017-12-05 NOTE — Progress Notes (Signed)
Wasted 100cc fentanyl gtt in sink with Alberteen SpindleJohn RN.

## 2017-12-05 NOTE — Consult Note (Signed)
Received consult for PEG placement. Patient already has PEG tube. Please feel free to call if we can be of assistance otherwise.   Wells GuilesKelly Rayburn , [facility] 12/05/2017, 1:48 PM Pager: [phone] Mon-Fri 7:00 am-4:30 pm Sat-Sun 7:00 am-11:30 am

## 2017-12-05 NOTE — Progress Notes (Signed)
Nutrition Follow-up  DOCUMENTATION CODES:   Not applicable  INTERVENTION:   Tube Feeding:  Vital AF 1.2 @ 45 ml/hr Pro-Stat 30 mL 5 times daily Provides 156 g of protein, 1796 kcals, 875 mL of free water  NUTRITION DIAGNOSIS:   Inadequate oral intake related to inability to eat as evidenced by NPO status.  Being addressed via TF   GOAL:   Patient will meet greater than or equal to 90% of their needs  Met  MONITOR:   Vent status, TF tolerance, Labs, I & O's, Skin  REASON FOR ASSESSMENT:   Ventilator, Consult(Verbal) Enteral/tube feeding initiation and management  ASSESSMENT:   72 yo female admitted with acute on chronic respiratory failure in setting of decompensated heart failure vs PNA, hx of COPD.Marland Kitchen Pt with hx of DM, COPD, CHF, ESRD on HD, CVA, dysphagia s/p PEG tube   Pt remains on vent support, on levophed and vasopressin, precedex for sedation MV: 13.8 L/min Temp (24hrs), Avg:97.8 F (36.6 C), Min:97.6 F (36.4 C), Max:98.4 F (36.9 C)  Trach placement today, TF on hold for procedure Tolerating Vital AF 1.2 @ 50, Pro-Stat 60 mL BID via PEG tube  Net negative 9 L since admission; weight on admission 87.6 kg, current weight 78.6 kg. Weight loss can be explained via fluid losses  +liquid stool, diarrhea; rectal tube inserted Labs: phosphorus 2.0 (L, being repleted) Meds: reviewed   Diet Order:   Diet Order           Diet NPO time specified  Diet effective midnight          EDUCATION NEEDS:   No education needs have been identified at this time  Skin:  Skin Assessment: Skin Integrity Issues: Skin Integrity Issues:: Stage II Stage II: Sacrum  Last BM:  Unknown  Height:   Ht Readings from Last 1 Encounters:  12/01/17 5' 6"  (1.676 m)    Weight:   Wt Readings from Last 1 Encounters:  12/05/17 173 lb 4.5 oz (78.6 kg)    Ideal Body Weight:  59.1 kg  BMI:  Body mass index is 27.97 kg/m.  Estimated Nutritional Needs:   Kcal:  1811  kcals   Protein:  157-173 g  Fluid:  Per MD goals   Kerman Passey MS, RD, LDN, CNSC [phone] Pager  [phone] Weekend/On-Call Pager

## 2017-12-05 NOTE — Progress Notes (Addendum)
ANTICOAGULATION CONSULT NOTE  Pharmacy Consult for heparin Indication: atrial fibrillation  Patient Measurements: Height: 5\' 6"  (167.6 cm)(Per [facility] documents) Weight: 173 lb 4.5 oz (78.6 kg) IBW/kg (Calculated) : 59.3 Heparin Dosing Weight: 75.8 kg  Vital Signs: Temp: 98 F (36.7 C) (06/12 1500) Temp Source: Axillary (06/12 1500) BP: 122/60 (06/12 1600) Pulse Rate: 68 (06/12 1600)  Labs: Recent Labs    12/03/17 0500  12/04/17 0349 12/04/17 1701 12/04/17 1800 12/05/17 0434 12/05/17 1607  HGB 8.7*  --  9.0*  --   --  8.7*  --   HCT 31.8*  --  32.4*  --   --  30.8*  --   PLT 205  --  230  --   --  244  --   LABPROT 28.3*  --  20.3*  --   --  18.2*  --   INR 2.68  --  1.75  --   --  1.52  --   HEPARINUNFRC  --   --   --   --  0.92* 0.34  --   CREATININE  --    < > 1.00 0.88  --  0.80  0.85 0.83   < > = values in this interval not displayed.     Assessment: 72 yo female previously on Eliquis and was recently transitioned to warfarin - as documented in shadow chart. INR remained therapeutic for several days without receiving any anticoagulation. Heparin was initiated when INR fell < 2. Heparin level this morning is therapeutic. Hgb 8.7, plts wnl.  Of note, IV heparin was held this afternoon for tracheostomy. Discussed with CCM team who is amenable to restarting IV heparin at 1900 today   Goal of Therapy:  Heparin level 0.3-0.7 units/ml Monitor platelets by anticoagulation protocol: Yes     Plan:  -Resume IV heparin at 950 units/hr at 1900 without bolus -Daily HL, CBC -Check 8 hour level -F/u plans for resuming warfarin  Vinnie LevelBenjamin Mancheril, PharmD., BCPS Clinical Pharmacist Clinical phone for 12/05/17 until 10:30pm: [phone] If after 10:30pm, please call main pharmacy at: [phone]  Addum:  Heparin level 0.51 units/ml.  Cont heparin at 950 units/hr St. ThomasLora Mennie Spiller,PharmD

## 2017-12-05 NOTE — Procedures (Signed)
Percutaneous Tracheostomy Placement  Consent from family.  Patient sedated, paralyzed and position.  Placed on 100% FiO2 and RR matched.  Area cleaned and draped.  Lidocaine/epi injected.  Skin incision done followed by blunt dissection.  Trachea palpated then punctured, catheter passed and visualized bronchoscopically.  Wire placed and visualized.  Catheter removed.  Airway then entered and dilated.  Size 6 cuffed shiley trach placed and visualized bronchoscopically well above carina.  Good volume returns.  Patient tolerated the procedure well without complications.  Minimal blood loss.  CXR ordered and pending.  Isahia Hollerbach G. Jeanelle Dake, M.D. [facility]. Pager: [phone]. After hours pager: [phone].  

## 2017-12-06 ENCOUNTER — Inpatient Hospital Stay (HOSPITAL_COMMUNITY): Payer: Medicare Other

## 2017-12-06 LAB — BASIC METABOLIC PANEL
Anion gap: 12 (ref 5–15)
BUN: 25 mg/dL — ABNORMAL HIGH (ref 6–20)
CALCIUM: 9 mg/dL (ref 8.9–10.3)
CO2: 27 mmol/L (ref 22–32)
Chloride: 101 mmol/L (ref 101–111)
Creatinine, Ser: 0.77 mg/dL (ref 0.44–1.00)
GFR calc non Af Amer: >60 mL/min (ref >60)
GLUCOSE: 138 mg/dL — AB (ref 65–99)
Potassium: 3.6 mmol/L (ref 3.5–5.1)
Sodium: 140 mmol/L (ref 135–145)

## 2017-12-06 LAB — PHOSPHORUS: Phosphorus: 2.4 mg/dL — ABNORMAL LOW (ref 2.5–4.6)

## 2017-12-06 LAB — RENAL FUNCTION PANEL
ANION GAP: 10 (ref 5–15)
Albumin: 2.8 g/dL — ABNORMAL LOW (ref 3.5–5.0)
BUN: 29 mg/dL — ABNORMAL HIGH (ref 6–20)
CO2: 27 mmol/L (ref 22–32)
Calcium: 9.1 mg/dL (ref 8.9–10.3)
Chloride: 102 mmol/L (ref 101–111)
Creatinine, Ser: 0.7 mg/dL (ref 0.44–1.00)
GFR calc Af Amer: >60 mL/min (ref >60)
GFR calc non Af Amer: >60 mL/min (ref >60)
GLUCOSE: 165 mg/dL — AB (ref 65–99)
POTASSIUM: 3.9 mmol/L (ref 3.5–5.1)
Phosphorus: 1.6 mg/dL — ABNORMAL LOW (ref 2.5–4.6)
SODIUM: 139 mmol/L (ref 135–145)

## 2017-12-06 LAB — HEPATIC FUNCTION PANEL
ALT: 16 U/L (ref 14–54)
AST: 29 U/L (ref 15–41)
Albumin: 2.9 g/dL — ABNORMAL LOW (ref 3.5–5.0)
Alkaline Phosphatase: 94 U/L (ref 38–126)
Bilirubin, Direct: 0.2 mg/dL (ref 0.1–0.5)
Indirect Bilirubin: 0.4 mg/dL (ref 0.3–0.9)
TOTAL PROTEIN: 6.7 g/dL (ref 6.5–8.1)
Total Bilirubin: 0.6 mg/dL (ref 0.3–1.2)

## 2017-12-06 LAB — CBC
HEMATOCRIT: 29.4 % — AB (ref 36.0–46.0)
Hemoglobin: 8.5 g/dL — ABNORMAL LOW (ref 12.0–15.0)
MCH: 28.4 pg (ref 26.0–34.0)
MCHC: 28.9 g/dL — AB (ref 30.0–36.0)
MCV: 98.3 fL (ref 78.0–100.0)
Platelets: 227 10*3/uL (ref 150–400)
RBC: 2.99 MIL/uL — ABNORMAL LOW (ref 3.87–5.11)
RDW: 19.3 % — AB (ref 11.5–15.5)
WBC: 14.9 10*3/uL — ABNORMAL HIGH (ref 4.0–10.5)

## 2017-12-06 LAB — GLUCOSE, CAPILLARY
GLUCOSE-CAPILLARY: 172 mg/dL — AB (ref 65–99)
GLUCOSE-CAPILLARY: 156 mg/dL — AB (ref 65–99)
Glucose-Capillary: 132 mg/dL — ABNORMAL HIGH (ref 65–99)
Glucose-Capillary: 158 mg/dL — ABNORMAL HIGH (ref 65–99)
Glucose-Capillary: 166 mg/dL — ABNORMAL HIGH (ref 65–99)
Glucose-Capillary: 184 mg/dL — ABNORMAL HIGH (ref 65–99)

## 2017-12-06 LAB — PROTIME-INR
INR: 1.29
Prothrombin Time: 16 seconds — ABNORMAL HIGH (ref 11.4–15.2)

## 2017-12-06 LAB — POCT I-STAT 3, ART BLOOD GAS (G3+)
Bicarbonate: 26.3 mmol/L (ref 20.0–28.0)
O2 SAT: 92 %
PCO2 ART: 48.6 mmHg — AB (ref 32.0–48.0)
PO2 ART: 70 mmHg — AB (ref 83.0–108.0)
TCO2: 28 mmol/L (ref 22–32)
pH, Arterial: 7.342 — ABNORMAL LOW (ref 7.350–7.450)

## 2017-12-06 LAB — HEPARIN LEVEL (UNFRACTIONATED): Heparin Unfractionated: 0.51 IU/mL (ref 0.30–0.70)

## 2017-12-06 LAB — MAGNESIUM: MAGNESIUM: 2.7 mg/dL — AB (ref 1.7–2.4)

## 2017-12-06 MED ORDER — MIDODRINE HCL 5 MG PO TABS
10.0000 mg | ORAL_TABLET | Freq: Three times a day (TID) | ORAL | Status: DC
  Administered 2017-12-06 – 2017-12-08 (×8): 10 mg via ORAL
  Filled 2017-12-06 (×8): qty 2

## 2017-12-06 MED ORDER — FENTANYL CITRATE (PF) 100 MCG/2ML IJ SOLN
25.0000 ug | INTRAMUSCULAR | Status: DC | PRN
  Administered 2017-12-06 – 2017-12-10 (×3): 25 ug via INTRAVENOUS
  Filled 2017-12-06 (×3): qty 2

## 2017-12-06 MED ORDER — ORAL CARE MOUTH RINSE
15.0000 mL | OROMUCOSAL | Status: DC
  Administered 2017-12-06 – 2017-12-11 (×49): 15 mL via OROMUCOSAL

## 2017-12-06 MED ORDER — CHLORHEXIDINE GLUCONATE 0.12% ORAL RINSE (MEDLINE KIT)
15.0000 mL | Freq: Two times a day (BID) | OROMUCOSAL | Status: DC
  Administered 2017-12-06 – 2017-12-13 (×16): 15 mL via OROMUCOSAL

## 2017-12-06 MED ORDER — ATORVASTATIN CALCIUM 80 MG PO TABS
80.0000 mg | ORAL_TABLET | Freq: Every day | ORAL | Status: DC
  Administered 2017-12-06 – 2017-12-14 (×9): 80 mg
  Filled 2017-12-06 (×9): qty 1

## 2017-12-06 MED ORDER — VITAMIN B-1 100 MG PO TABS
100.0000 mg | ORAL_TABLET | Freq: Every day | ORAL | Status: DC
  Administered 2017-12-06 – 2017-12-14 (×9): 100 mg
  Filled 2017-12-06 (×9): qty 1

## 2017-12-06 MED ORDER — FOLIC ACID 1 MG PO TABS
1.0000 mg | ORAL_TABLET | Freq: Every day | ORAL | Status: DC
  Administered 2017-12-06 – 2017-12-14 (×9): 1 mg
  Filled 2017-12-06 (×9): qty 1

## 2017-12-06 MED ORDER — TRAZODONE HCL 50 MG PO TABS: 50.0000 mg | ORAL_TABLET | Freq: Every evening | ORAL | Status: DC | PRN

## 2017-12-06 MED ORDER — ALTEPLASE 2 MG IJ SOLR
2.0000 mg | Freq: Once | INTRAMUSCULAR | Status: AC | PRN
  Administered 2017-12-06: 2 mg
  Filled 2017-12-06: qty 2

## 2017-12-06 MED ORDER — QUETIAPINE FUMARATE 25 MG PO TABS
25.0000 mg | ORAL_TABLET | Freq: Every day | ORAL | Status: DC
  Administered 2017-12-06 – 2017-12-07 (×2): 25 mg
  Filled 2017-12-06 (×2): qty 1

## 2017-12-06 MED ORDER — ESCITALOPRAM OXALATE 20 MG PO TABS
20.0000 mg | ORAL_TABLET | Freq: Every day | ORAL | Status: DC
  Administered 2017-12-06 – 2017-12-14 (×9): 20 mg
  Filled 2017-12-06 (×9): qty 1

## 2017-12-06 NOTE — Progress Notes (Signed)
PULMONARY / CRITICAL CARE MEDICINE   Name: Shaaron Adlervelyn Singleton MRN: [id number] DOB: 1945/07/24    ADMISSION DATE:  11/30/2017 CONSULTATION DATE:  11/30/2017  [facility]ING MD:  [facility]   CHIEF COMPLAINT:  Respiratory Distress   HISTORY OF PRESENT ILLNESS:   72 year old female with PMH of DM, COPD, Diastolic HF, ESRD, CVA, Dysphagia s/p PEG  [facility] 5[facility]6 > Presented with hypoxia/AMS secondary to PNA requiring intubation, [facility]. Due to GI bleed Eliquis was held, during this time developed acute bilateral cortical strokes. Previously had Stage 4 CKD however this progressed to ESRD with needs for HD. Extubated (unknown time frame), however due to inability to wean off BiPAP patient was transferred to [facility] on 5/31.    While at [facility] was intermittently off BiPAP for short periods. On 6/7 required intubation for respiratory distress. Due to inability to tolerate HD with bradycardia and hypotension [facility]   SUBJECTIVE:  Trach yesterday.  Remains on CRRT, pressors.  VITAL SIGNS: BP (!) 105/56   Pulse 65   Temp (!) 96 F (35.6 C)   Resp (!) 36   Ht 5\' 6"  (1.676 m) Comment: Per [facility] documents  Wt 166 lb 0.1 oz (75.3 kg)   SpO2 100%   BMI 26.79 kg/m   VENTILATOR SETTINGS: Vent Mode: PRVC FiO2 (%):  [40 %-60 %] 50 % Set Rate:  [26 bmp] 26 bmp Vt Set:  [510 mL] 510 mL PEEP:  [5 cmH20-12 cmH20] 5 cmH20 Plateau Pressure:  [18 cmH20-24 cmH20] 20 cmH20  INTAKE / OUTPUT: I/O last 3 completed shifts: In: 3150.3 [I.V.:1815.5; Other:60; NW/GN:562.1G/GT:866.8; IV Piggyback:408] Out: Hart.Albright6681 [HYQMV:7846[Other:6581; Stool:100]  PHYSICAL EXAMINATION:  General - alert Eyes - pupils reactive ENT - trach site clean Cardiac - regular, no murmur Chest - decreased BS, no wheeze Abd - soft, non tender Ext - no edema Skin - no rashes Neuro - follows simple commands  LABS:  BMET Recent Labs  Lab 12/05/17 0434 12/05/17 1607 12/06/17 0426   NA 138  138 140 140  K 3.9  3.9 3.5 3.6  CL 102  102 97* 101  CO2 28  28 25 27   BUN 24*  24* 26* 25*  CREATININE 0.80  0.85 0.83 0.77  GLUCOSE 227*  228* 298* 138*   Electrolytes Recent Labs  Lab 12/04/17 0349  12/05/17 0434 12/05/17 1607 12/06/17 0426  CALCIUM 9.0   < > 9.0  9.0 8.5* 9.0  MG 2.5*  --  2.3  --  2.7*  PHOS 1.6*   < > 2.0*  2.0* 15.9* 2.4*   < > = values in this interval not displayed.   CBC Recent Labs  Lab 12/04/17 0349 12/05/17 0434 12/06/17 0426  WBC 16.5* 15.4* 14.9*  HGB 9.0* 8.7* 8.5*  HCT 32.4* 30.8* 29.4*  PLT 230 244 227   Coag's Recent Labs  Lab 12/04/17 0349 12/05/17 0434 12/06/17 0427  INR 1.75 1.52 1.29   Sepsis Markers Recent Labs  Lab 11/30/17 2126 11/30/17 2130 12/01/17 0401 12/02/17 0420  LATICACIDVEN  --  1.0  --   --   PROCALCITON 0.70  --  0.59 0.82   ABG Recent Labs  Lab 12/04/17 0535 12/05/17 0510 12/06/17 0516  PHART 7.238* 7.337* 7.342*  PCO2ART 62.6* 48.5* 48.6*  PO2ART 43.0* 104 70.0*   Liver Enzymes Recent Labs  Lab 12/05/17 0434 12/05/17 1607 12/06/17 0426  AST  --   --  29  ALT  --   --  16  ALKPHOS  --   --  94  BILITOT  --   --  0.6  ALBUMIN 2.9* 2.7* 2.9*   Cardiac Enzymes Recent Labs  Lab 11/30/17 2126 12/01/17 0401 12/01/17 1302  TROPONINI 0.06* 0.05* 0.05*   Glucose Recent Labs  Lab 12/05/17 0710 12/05/17 1125 12/05/17 1545 12/05/17 1943 12/06/17 0015 12/06/17 0425  GLUCAP 239* 161* 271* 149* 158* 132*   Imaging Dg Chest Port 1 View  Result Date: 12/06/2017 CLINICAL DATA:  Tracheostomy EXAM: PORTABLE CHEST 1 VIEW COMPARISON:  12/05/2017 FINDINGS: Tracheostomy and right dialysis catheter remain in place, unchanged. There is a right basilar pneumothorax. This is new since prior study and approximately 5-10% in size. Small bilateral effusions. Bibasilar atelectasis. Heart is normal size. IMPRESSION: New 5-10% right basilar pneumothorax. Small effusions and bibasilar  atelectasis. Electronically Signed   By: Charlett Nose M.D.   On: 12/06/2017 07:12   Dg Chest Port 1 View  Result Date: 12/05/2017 CLINICAL DATA:  72 year old female postoperative day zero status post tracheostomy. Prolonged hospitalization secondary to respiratory distress, end-stage renal disease. EXAM: PORTABLE CHEST 1 VIEW COMPARISON:  0501 hours today and earlier. FINDINGS: Portable AP semi upright view at 1517 hours. The patient remains rotated to the left. Endotracheal tube has been removed and tracheostomy tube has been placed and appears in good position. Stable bilateral IJ approach central lines, dual lumen dialysis type on the right. Stable lung volumes. Stable cardiomegaly and mediastinal contours. Interval increased opacity at the lung bases, partially obscuring the left hemidiaphragm. No pneumothorax or pulmonary edema. IMPRESSION: 1. Tracheostomy tube placed with no adverse features. 2. Interval increased lung base opacity is nonspecific and might reflect atelectasis. Electronically Signed   By: Odessa Fleming M.D.   On: 12/05/2017 15:38    STUDIES:  Echo 6/08 >> EF 65 to 70%, grade 1 DD  CULTURES: Blood 6/7 >> negative Sputum 6/7 >> Proteus  ANTIBIOTICS: 6/08 Zosyn>> 6/10 6/08 vancomycin>> 6/10 6/11 Rocephin >>   SIGNIFICANT EVENTS: 6/7 Transported to [facility]  6/8 started on dobutamine drip 6/8  cardiology consult  LINES/TUBES: CVC Left IJ 6/7 (OSH) >>  ETT 6/6 (OSH) >> 6/12 Right IJ Tunneled HD >>  Trach 6/12 >>  DISCUSSION: 72 yo female with VDRF from HCAP with Proteus complicated by septic shock.  Failure to wean s/p trach.  Small Rt basilar PTX on CXR 6/13, but doesn't appear to be causing respiratory or hemodynamic compromise.  ASSESSMENT / PLAN:  Acute respiratory failure. Failure to wean s/p trach. Hx of COPD. - full vent support - scheduled duoneb  Rt basilar PTX. - f/u CXR - if progressing, then will need chest tube  Septic shock. - pressors to keep  MAP > 65  HCAP with proteus. - day 6/7 of Abx  HFpEF, A fib, chronic hypotension, HLD. - heparin gtt - midodrine, lipitor  ESRD. - CRRT per renal  Anemia of critical illness and chronic disease. - f/u CBC - aranesp  DM type II. - SSI  Dysphagia. - surgery consulted for PEG  Acute metabolic encephalopathy. Hx of CVA with Lt hemiplegia, depression/anxiety. - RASS goal 0 to -1 - lexapro, seroquel, trazodone  DVT prophylaxis - heparin gtt SUP - protonix Nutrition - tube feeds Goals of care - full code  CC time 32 minutes  Coralyn Helling, MD [facility] Pulmonary/Critical Care 12/06/2017, 7:41 AM

## 2017-12-06 NOTE — Care Management Note (Signed)
Case Management Note  Patient Details  Name: Sheila Cabrera MRN: [id number] Date of Birth: 07-Aug-1945  Subjective/Objective:      [facility]             Action/Plan:  [facility] P[facility] - liason aware and will offer pt bed when medically ready for discharge.  Pt is now trached still requiring ventilator.  Pt is alert however orientation is in question.  CM confirmed with pts son that [facility]. CM will continue to follow for discharge needs   Expected Discharge Date:                  Expected Discharge Plan:  Long Term Acute Care (LTAC)(from [facility])  In-House Referral:     Discharge planning Services  CM Consult  Post Acute Care Choice:    Choice offered to:     DME Arranged:    DME Agency:     HH Arranged:    HH Agency:     Status of Service:     If discussed at [employer] Meetings, dates discussed:    Additional Comments: 12/06/2017  Cherylann Parrlaxton, Janise Gora S, RN 12/06/2017, 10:07 AM

## 2017-12-06 NOTE — Procedures (Signed)
Admit: 11/30/2017 LOS: 6  89F ESRD admit from LTAC for hypotension, VDRF/Hypervolemia/Pulm Edema, inability to UF with iHD  Current CRRT Prescription: Start Date: 12/01/17 Catheter: R IJ Tunneled HD Cath BFR: 150 Pre Blood Pump: 500 4K DFR: 1500 4K Replacement Rate: 200 4K Goal UF: 116m/hr net engative Anticoagulation: none in circuit, systemic heparin Clotting: infrequent  S: No new events, s/p trach yesterday Remains on pressors  O: 06/12 0701 - 06/13 0700 In: 2363.2 [I.V.:1208.5; NG/GT:716.8; IV Piggyback:408] Out: 42831[Stool:100]   Chronically Ill Appearing, intubated R IJ Tunneled HD cath noted Nl S1s2, no rub IRIR Coarse BS b/l 1+ LEE R sided PTX on CXR this AM  [facility]   12/04/17 0500 12/05/17 0530 12/06/17 0427  Weight: 80 kg (176 lb 5.9 oz) 78.6 kg (173 lb 4.5 oz) 75.3 kg (166 lb 0.1 oz)    Recent Labs  Lab 12/05/17 0434 12/05/17 1607 12/06/17 0426  NA 138  138 140 140  K 3.9  3.9 3.5 3.6  CL 102  102 97* 101  CO2 28  28 25 27   GLUCOSE 227*  228* 298* 138*  BUN 24*  24* 26* 25*  CREATININE 0.80  0.85 0.83 0.77  CALCIUM 9.0  9.0 8.5* 9.0  PHOS 2.0*  2.0* 15.9* 2.4*   Recent Labs  Lab 12/02/17 0420 12/03/17 0500 12/04/17 0349 12/05/17 0434 12/06/17 0426  WBC 15.7* 14.9* 16.5* 15.4* 14.9*  NEUTROABS 13.5* 12.3*  --   --   --   HGB 9.1* 8.7* 9.0* 8.7* 8.5*  HCT 31.6* 31.8* 32.4* 30.8* 29.4*  MCV 98.8 102.6* 101.9* 100.7* 98.3  PLT 207 205 230 244 227    Scheduled Meds: . atorvastatin  80 mg Per Tube q1800  . chlorhexidine gluconate (MEDLINE KIT)  15 mL Mouth Rinse BID  . Chlorhexidine Gluconate Cloth  6 each Topical Daily  . darbepoetin (ARANESP) injection - NON-DIALYSIS  60 mcg Subcutaneous Q Sat-1800  . escitalopram  20 mg Per Tube Daily  . feeding supplement (PRO-STAT SUGAR FREE 64)  30 mL Per Tube 5 X Daily  . folic acid  1 mg Per Tube Daily  . insulin aspart  2-6 Units Subcutaneous Q4H  . ipratropium-albuterol  3 mL  Nebulization Q6H  . mouth rinse  15 mL Mouth Rinse 10 times per day  . midodrine  10 mg Oral TID WC  . pantoprazole sodium  40 mg Per Tube Daily  . QUEtiapine  25 mg Per Tube QHS  . thiamine  100 mg Per Tube Daily   Continuous Infusions: . sodium chloride 10 mL/hr at 12/06/17 0200  . cefTRIAXone (ROCEPHIN)  IV Stopped (12/05/17 1238)  . dexmedetomidine (PRECEDEX) IV infusion 1.1 mcg/kg/hr (12/06/17 0847)  . feeding supplement (VITAL AF 1.2 CAL) 20 mL/hr at 12/06/17 0800  . fentaNYL infusion INTRAVENOUS Stopped (12/04/17 1600)  . heparin 950 Units/hr (12/05/17 1852)  . heparin 999 mL/hr at 11/30/17 2354  . norepinephrine (LEVOPHED) Adult infusion 3 mcg/min (12/05/17 2331)  . dialysis replacement fluid (prismasate) 500 mL/hr at 12/06/17 0416  . dialysis replacement fluid (prismasate) 200 mL/hr at 12/06/17 1009  . dialysate (PRISMASATE) 1,500 mL/hr at 12/06/17 1018  . vasopressin (PITRESSIN) infusion - *FOR SHOCK* Stopped (12/06/17 1115)   PRN Meds:.sodium chloride, albuterol, docusate, fentaNYL, heparin, heparin, magnesium hydroxide, midazolam, traZODone  ABG    Component Value Date/Time   PHART 7.342 (L) 12/06/2017 0516   PCO2ART 48.6 (H) 12/06/2017 0516   PO2ART 70.0 (L) 12/06/2017 05176  HCO3 26.3 12/06/2017 0516   TCO2 28 12/06/2017 0516   ACIDBASEDEF 2.0 12/04/2017 0535   O2SAT 92.0 12/06/2017 0516    A 1. ESRD, recent start of HD, has R IJ tunneled HD cath 2. Hypervolemia/Pulmonoary Edema/VDRF/COPD; trach 6/12 3. Anemia of CKD; recent IV Fe, on aranesp 36mg qSat; monitoring 4. Hypotension, ? Septic shock on NE+VP 5. AFib on anticoagulation, systemic heparin, warfarin on hold 6. Hx/o CVA with L sided hemiplegia, PEG tube  7. DM2 8. CKDBMD 9. Hypophosphatemia intermittently repleting 10. PEG  P  Cont CRRt at other settings  Cont CRRT while on pressors  Midodrine started to see if can help with pressor requirement Medication Issues: Preferred narcotic agents  for pain control are hydromorphone, fentanyl, and methadone. Morphine should not be used. Oxydone is not preferred.  Baclofen should not be used. Avoid Fleets and Magnesium Citrate Bowel Preps   RPearson Grippe MD [facility] pgr [phone]

## 2017-12-06 NOTE — Progress Notes (Signed)
Pt spitting up tan colored sputum. Tube feeding adjusted to trickle feed at 20cc/hr. Dr. Craige CottaSood made aware.

## 2017-12-07 ENCOUNTER — Inpatient Hospital Stay (HOSPITAL_COMMUNITY): Payer: Medicare Other

## 2017-12-07 LAB — RENAL FUNCTION PANEL
Albumin: 2.6 g/dL — ABNORMAL LOW (ref 3.5–5.0)
Anion gap: 7 (ref 5–15)
BUN: 26 mg/dL — AB (ref 6–20)
CALCIUM: 8.1 mg/dL — AB (ref 8.9–10.3)
CHLORIDE: 105 mmol/L (ref 101–111)
CO2: 23 mmol/L (ref 22–32)
CREATININE: 0.82 mg/dL (ref 0.44–1.00)
GFR calc Af Amer: >60 mL/min (ref >60)
GFR calc non Af Amer: >60 mL/min (ref >60)
GLUCOSE: 235 mg/dL — AB (ref 65–99)
Phosphorus: 1.5 mg/dL — ABNORMAL LOW (ref 2.5–4.6)
Potassium: 3.4 mmol/L — ABNORMAL LOW (ref 3.5–5.1)
SODIUM: 135 mmol/L (ref 135–145)

## 2017-12-07 LAB — MAGNESIUM: Magnesium: 2.2 mg/dL (ref 1.7–2.4)

## 2017-12-07 LAB — GLUCOSE, CAPILLARY
GLUCOSE-CAPILLARY: 169 mg/dL — AB (ref 65–99)
GLUCOSE-CAPILLARY: 194 mg/dL — AB (ref 65–99)
GLUCOSE-CAPILLARY: 151 mg/dL — AB (ref 65–99)
Glucose-Capillary: 176 mg/dL — ABNORMAL HIGH (ref 65–99)
Glucose-Capillary: 197 mg/dL — ABNORMAL HIGH (ref 65–99)
Glucose-Capillary: 212 mg/dL — ABNORMAL HIGH (ref 65–99)

## 2017-12-07 LAB — CBC
HEMATOCRIT: 28.9 % — AB (ref 36.0–46.0)
HEMOGLOBIN: 8.2 g/dL — AB (ref 12.0–15.0)
MCH: 28.1 pg (ref 26.0–34.0)
MCHC: 28.4 g/dL — ABNORMAL LOW (ref 30.0–36.0)
MCV: 99 fL (ref 78.0–100.0)
Platelets: 198 10*3/uL (ref 150–400)
RBC: 2.92 MIL/uL — AB (ref 3.87–5.11)
RDW: 19.7 % — ABNORMAL HIGH (ref 11.5–15.5)
WBC: 20.6 10*3/uL — ABNORMAL HIGH (ref 4.0–10.5)

## 2017-12-07 LAB — PROTIME-INR
INR: 1.37
PROTHROMBIN TIME: 16.8 s — AB (ref 11.4–15.2)

## 2017-12-07 LAB — HEPARIN LEVEL (UNFRACTIONATED)
HEPARIN UNFRACTIONATED: 2.16 [IU]/mL — AB (ref 0.30–0.70)
Heparin Unfractionated: 0.32 IU/mL (ref 0.30–0.70)

## 2017-12-07 MED ORDER — ATROPINE SULFATE 1 MG/10ML IJ SOSY
PREFILLED_SYRINGE | INTRAMUSCULAR | Status: AC
  Filled 2017-12-07: qty 10

## 2017-12-07 MED ORDER — EPINEPHRINE PF 1 MG/10ML IJ SOSY
PREFILLED_SYRINGE | INTRAMUSCULAR | Status: AC
  Filled 2017-12-07: qty 10

## 2017-12-07 MED ORDER — IPRATROPIUM-ALBUTEROL 0.5-2.5 (3) MG/3ML IN SOLN
3.0000 mL | RESPIRATORY_TRACT | Status: DC | PRN
  Filled 2017-12-07: qty 3

## 2017-12-07 MED ORDER — TRAZODONE HCL 50 MG PO TABS
50.0000 mg | ORAL_TABLET | Freq: Every day | ORAL | Status: DC
  Administered 2017-12-07: 50 mg
  Filled 2017-12-07: qty 1

## 2017-12-07 MED ORDER — SODIUM PHOSPHATES 45 MMOLE/15ML IV SOLN
30.0000 mmol | Freq: Once | INTRAVENOUS | Status: AC
  Administered 2017-12-07: 30 mmol via INTRAVENOUS
  Filled 2017-12-07: qty 10

## 2017-12-07 MED ORDER — HYDROCORTISONE NA SUCCINATE PF 100 MG IJ SOLR
50.0000 mg | Freq: Four times a day (QID) | INTRAMUSCULAR | Status: DC
  Administered 2017-12-07 – 2017-12-18 (×45): 50 mg via INTRAVENOUS
  Filled 2017-12-07 (×43): qty 2

## 2017-12-07 NOTE — Progress Notes (Signed)
ANTICOAGULATION CONSULT NOTE  Pharmacy Consult for heparin Indication: atrial fibrillation  Patient Measurements: Height: 5\' 6"  (167.6 cm)(Per [facility] documents) Weight: 155 lb 13.8 oz (70.7 kg) IBW/kg (Calculated) : 59.3 Heparin Dosing Weight: 75.8 kg  Vital Signs: Temp: 97.6 F (36.4 C) (06/14 0725) Temp Source: Oral (06/14 0725) BP: 123/55 (06/14 0900) Pulse Rate: 78 (06/14 0900)  Labs: Recent Labs    12/05/17 0434  12/06/17 0426 12/06/17 0427 12/06/17 1522 12/07/17 0434 12/07/17 0435 12/07/17 0814  HGB 8.7*  --  8.5*  --   --  8.2*  --   --   HCT 30.8*  --  29.4*  --   --  28.9*  --   --   PLT 244  --  227  --   --  198  --   --   LABPROT 18.2*  --   --  16.0*  --  16.8*  --   --   INR 1.52  --   --  1.29  --  1.37  --   --   HEPARINUNFRC 0.34  --   --  0.51  --   --  2.16* 0.32  CREATININE 0.80  0.85   < > 0.77  --  0.70 0.82  --   --    < > = values in this interval not displayed.   Assessment: 72 yo female previously on Eliquis and was recently transitioned to warfarin - as documented in shadow chart. INR remained therapeutic for several days without receiving any anticoagulation. Heparin was initiated when INR fell < 2. Heparin level this morning is therapeutic after rechecking. Initial level was an error. No bleeding noted.  Goal of Therapy:  Heparin level 0.3-0.7 units/ml Monitor platelets by anticoagulation protocol: Yes   Plan:  Continue heparin gtt at 950 units/hr Daily heparin level and CBC  Lysle Pearlachel Placido Hangartner, PharmD, BCPS Phone #: [phone] until 3pm All other times, call Main Pharmacy x [phone] 12/07/2017 9:45 AM

## 2017-12-07 NOTE — Procedures (Signed)
Admit: 11/30/2017 LOS: 7  74F ESRD admit from [facility] for hypotension, VDRF/Hypervolemia/Pulm Edema, inability to UF with iHD  Current CRRT Prescription: Start Date: 12/01/17 Catheter: R IJ Tunneled HD Cath BFR: 150 Pre Blood Pump: 500 4K DFR: 1500 4K Replacement Rate: 200 4K Goal UF: 140m/hr net engative Anticoagulation: none in circuit, systemic heparin Clotting: infrequent  S: Still on NE, on midodrine since yesterdy 2.4L negative yesterday  O: 06/13 0701 - 06/14 0700 In: 1931.8 [I.V.:1165.3; NG/GT:546.5; IV Piggyback:100] Out: 4395 [Stool:900]   Chronically Ill Appearing, intubated R IJ Tunneled HD cath noted Nl S1s2, no rub IRIR Coarse BS b/l Trace LEE   Filed Weights   12/05/17 0530 12/06/17 0427 12/07/17 0445  Weight: 78.6 kg (173 lb 4.5 oz) 75.3 kg (166 lb 0.1 oz) 70.7 kg (155 lb 13.8 oz)    Recent Labs  Lab 12/06/17 0426 12/06/17 1522 12/07/17 0434  NA 140 139 135  K 3.6 3.9 3.4*  CL 101 102 105  CO2 27 27 23   GLUCOSE 138* 165* 235*  BUN 25* 29* 26*  CREATININE 0.77 0.70 0.82  CALCIUM 9.0 9.1 8.1*  PHOS 2.4* 1.6* 1.5*   Recent Labs  Lab 12/02/17 0420 12/03/17 0500  12/05/17 0434 12/06/17 0426 12/07/17 0434  WBC 15.7* 14.9*   < > 15.4* 14.9* 20.6*  NEUTROABS 13.5* 12.3*  --   --   --   --   HGB 9.1* 8.7*   < > 8.7* 8.5* 8.2*  HCT 31.6* 31.8*   < > 30.8* 29.4* 28.9*  MCV 98.8 102.6*   < > 100.7* 98.3 99.0  PLT 207 205   < > 244 227 198   < > = values in this interval not displayed.    Scheduled Meds: . atorvastatin  80 mg Per Tube q1800  . chlorhexidine gluconate (MEDLINE KIT)  15 mL Mouth Rinse BID  . Chlorhexidine Gluconate Cloth  6 each Topical Daily  . darbepoetin (ARANESP) injection - NON-DIALYSIS  60 mcg Subcutaneous Q Sat-1800  . escitalopram  20 mg Per Tube Daily  . feeding supplement (PRO-STAT SUGAR FREE 64)  30 mL Per Tube 5 X Daily  . folic acid  1 mg Per Tube Daily  . insulin aspart  2-6 Units Subcutaneous Q4H  .  ipratropium-albuterol  3 mL Nebulization Q6H  . mouth rinse  15 mL Mouth Rinse 10 times per day  . midodrine  10 mg Oral TID WC  . pantoprazole sodium  40 mg Per Tube Daily  . QUEtiapine  25 mg Per Tube QHS  . thiamine  100 mg Per Tube Daily   Continuous Infusions: . sodium chloride 250 mL (12/06/17 1828)  . cefTRIAXone (ROCEPHIN)  IV Stopped (12/06/17 1244)  . dexmedetomidine (PRECEDEX) IV infusion 1.1 mcg/kg/hr (12/07/17 0757)  . feeding supplement (VITAL AF 1.2 CAL) 45 mL/hr at 12/07/17 0600  . heparin Stopped (12/07/17 0759)  . heparin 999 mL/hr at 11/30/17 2354  . norepinephrine (LEVOPHED) Adult infusion 7 mcg/min (12/07/17 0556)  . dialysis replacement fluid (prismasate) 500 mL/hr at 12/07/17 0400  . dialysis replacement fluid (prismasate) 200 mL/hr at 12/06/17 1009  . dialysate (PRISMASATE) 1,500 mL/hr at 12/07/17 0646   PRN Meds:.sodium chloride, albuterol, docusate, fentaNYL (SUBLIMAZE) injection, heparin, heparin, magnesium hydroxide, midazolam, traZODone  ABG    Component Value Date/Time   PHART 7.342 (L) 12/06/2017 0516   PCO2ART 48.6 (H) 12/06/2017 0516   PO2ART 70.0 (L) 12/06/2017 0516   HCO3 26.3 12/06/2017 0516  TCO2 28 12/06/2017 0516   ACIDBASEDEF 2.0 12/04/2017 0535   O2SAT 92.0 12/06/2017 0516    A 1. ESRD, recent start of HD, has R IJ tunneled HD cath 2. Hypervolemia/Pulmonoary Edema/VDRF/COPD; trach 6/12 3. Anemia of CKD; recent IV Fe, on aranesp 43mg qSat; monitoring 4. Hypotension, ? Septic shock on NE + midodrine 5. AFib on anticoagulation, systemic heparin, warfarin on hold 6. Hx/o CVA with L sided hemiplegia, PEG tube  7. DM2 8. CKDBMD 9. Hypophosphatemia intermittently repleting 10. PEG  P  Cont CRRt at other settings; will reduce UF to 573mhr net neg see if can get off NE  Cont CRRT while on pressors  Replete Phos  Cont Midodrine Medication Issues: Preferred narcotic agents for pain control are hydromorphone, fentanyl, and  methadone. Morphine should not be used. Oxydone is not preferred.  Baclofen should not be used. Avoid Fleets and Magnesium Citrate Bowel Preps   RyPearson GrippeMD [facility] pgr [phone]

## 2017-12-07 NOTE — Progress Notes (Signed)
PULMONARY / CRITICAL CARE MEDICINE   Name: Shaaron Adlervelyn Oakley MRN: [id number] DOB: 11-Jan-1946    ADMISSION DATE:  11/30/2017 CONSULTATION DATE:  11/30/2017  [facility]ING MD:  [facility]   CHIEF COMPLAINT:  Respiratory Distress   HISTORY OF PRESENT ILLNESS:   72 [facility] with hypoxia and altered mental status from PNA requiring intubation, and [facility].  Course complicated by GI bleed.  Developed acute b/l cortical strokes while off eliquis.  Transferred to [facility] 5/31, and was using Bipap.  Developed worsening respiratory distress and intubated on 11/30/17.  She was not able to tolerate intermittent HD and [facility].  PMHx of DM, COPD, A fib, HFpEF, ESRD, CVA with Lt hemiplegia, dysphagia.  SUBJECTIVE:  Remains on vent, pressors, CRRT.  VITAL SIGNS: BP (!) 90/56   Pulse 68   Temp 97.6 F (36.4 C) (Oral)   Resp (!) 22   Ht 5\' 6"  (1.676 m) Comment: [facility]  Wt 155 lb 13.8 oz (70.7 kg)   SpO2 100%   BMI 25.16 kg/m   VENTILATOR SETTINGS: Vent Mode: PRVC FiO2 (%):  [50 %] 50 % Set Rate:  [26 bmp] 26 bmp Vt Set:  [510 mL] 510 mL PEEP:  [5 cmH20] 5 cmH20 Plateau Pressure:  [18 cmH20-22 cmH20] 18 cmH20  INTAKE / OUTPUT: I/O last 3 completed shifts: In: 3182.9 [I.V.:1846.4; Other:150; NG/GT:1086.5; IV Piggyback:100] Out: 6820 [Other:5820; Stool:1000]  PHYSICAL EXAMINATION:  General - alert Eyes - pupils reactive ENT - trach site clean Cardiac - regular, no murmur Chest - no wheeze, rales Abd - soft, non tender Ext - no edema Skin - no rashes Neuro - follows simple commands  LABS:  BMET Recent Labs  Lab 12/06/17 0426 12/06/17 1522 12/07/17 0434  NA 140 139 135  K 3.6 3.9 3.4*  CL 101 102 105  CO2 27 27 23   BUN 25* 29* 26*  CREATININE 0.77 0.70 0.82  GLUCOSE 138* 165* 235*   Electrolytes Recent Labs  Lab 12/05/17 0434  12/06/17 0426 12/06/17 1522 12/07/17 0434  CALCIUM 9.0   9.0   < > 9.0 9.1 8.1*  MG 2.3  --  2.7*  --  2.2  PHOS 2.0*  2.0*   < > 2.4* 1.6* 1.5*   < > = values in this interval not displayed.   CBC Recent Labs  Lab 12/05/17 0434 12/06/17 0426 12/07/17 0434  WBC 15.4* 14.9* 20.6*  HGB 8.7* 8.5* 8.2*  HCT 30.8* 29.4* 28.9*  PLT 244 227 198   Coag's Recent Labs  Lab 12/05/17 0434 12/06/17 0427 12/07/17 0434  INR 1.52 1.29 1.37   Sepsis Markers Recent Labs  Lab 11/30/17 2126 11/30/17 2130 12/01/17 0401 12/02/17 0420  LATICACIDVEN  --  1.0  --   --   PROCALCITON 0.70  --  0.59 0.82   ABG Recent Labs  Lab 12/04/17 0535 12/05/17 0510 12/06/17 0516  PHART 7.238* 7.337* 7.342*  PCO2ART 62.6* 48.5* 48.6*  PO2ART 43.0* 104 70.0*   Liver Enzymes Recent Labs  Lab 12/06/17 0426 12/06/17 1522 12/07/17 0434  AST 29  --   --   ALT 16  --   --   ALKPHOS 94  --   --   BILITOT 0.6  --   --   ALBUMIN 2.9* 2.8* 2.6*   Cardiac Enzymes Recent Labs  Lab 11/30/17 2126 12/01/17 0401 12/01/17 1302  TROPONINI 0.06* 0.05* 0.05*   Glucose Recent  Labs  Lab 12/06/17 1211 12/06/17 1513 12/06/17 1952 12/07/17 0003 12/07/17 0414 12/07/17 0738  GLUCAP 184* 156* 166* 176* 197* 169*   Imaging Dg Chest Port 1 View  Result Date: 12/07/2017 CLINICAL DATA:  Respiratory failure. EXAM: PORTABLE CHEST 1 VIEW COMPARISON:  12/06/2017.  12/05/2017. FINDINGS: Tracheostomy tube, left subclavian line, right dialysis catheter in stable position. Stable cardiomegaly. Stable bilateral interstitial prominence and small bilateral pleural effusions suggesting CHF. Tiny right apical and right base pneumothorax appears smaller on today's exam. Minimal residual. IMPRESSION: 1. Tiny right apical and right base pneumothorax appears smaller on today's exam. Minimal residual. 2.  Lines and tubes in stable position. 3. Stable cardiomegaly. Diffuse bilateral interstitial prominence and small pleural effusions again noted consistent CHF. No interim change.  Electronically Signed   By: Maisie Fus  Register   On: 12/07/2017 06:16   Dg Chest Port 1 View  Result Date: 12/06/2017 CLINICAL DATA:  Respiratory failure, hemodialysis EXAM: PORTABLE CHEST 1 VIEW COMPARISON:  Portable chest x-ray of 12/06/2017 and 12/05/2016 FINDINGS: There is little change in the right basilar pneumothorax with a small apical component on the right. Also there do appear to be persistent small pleural effusions with mild bibasilar atelectasis. The heart is within upper limits of normal. Right hemodialysis catheter tip overlies the mid SVC and tracheostomy is noted. Left PICC line tip also overlies the mid SVC. IMPRESSION: 1. Little change in right basilar pneumothorax with small apical component. 2. No significant change in small effusions with basilar atelectasis. Electronically Signed   By: Dwyane Dee M.D.   On: 12/06/2017 10:40    STUDIES:  Echo 6/08 >> EF 65 to 70%, grade 1 DD  CULTURES: Blood 6/7 >> negative Sputum 6/7 >> Proteus  ANTIBIOTICS: 6/08 Zosyn>> 6/10 6/08 vancomycin>> 6/10 6/11 Rocephin >>   SIGNIFICANT EVENTS: 6/07 Transported to [facility]  6/08 started on dobutamine drip 6/08 cardiology consult  LINES/TUBES: CVC Left IJ 6/7 (OSH) >>  ETT 6/6 (OSH) >> 6/12 Right IJ Tunneled HD >>  Trach 6/12 >>  DISCUSSION: 72 yo female with VDRF from HCAP with Proteus complicated by septic shock.  Failure to wean s/p trach.  Inability to tolerate intermittent HD due to bradycardia and low blood pressure, so remains on CRRT.  ASSESSMENT / PLAN:  Acute respiratory failure. Failure to wean s/p trach. Hx of COPD. - pressure support wean as able - f//u CXR - prn BDs  Rt basilar PTX. - likely from posterior membrane leak after perc trach - smaller on CXR 6/14 - f/u CXR  Septic shock. - wean pressors to keep MAP > 65 - check cortisol  HCAP with proteus. - day 7/7 of ABx >> d/c after dose on 6/14  HFpEF, A fib, chronic hypotension, HLD. - heparin  gtt - midodrine increased to 10 mg tid on 6/14 - continue lipitor  ESRD. - CRRT per renal  Anemia of critical illness and chronic disease. - f/u CBC - aranesp  DM type II. - SSI  Dysphagia. - PEG feedings  Acute metabolic encephalopathy. Hx of CVA with Lt hemiplegia, depression/anxiety. - RASS goal 0 to -1 - lexapro, seroquel, trazodone  DVT prophylaxis - heparin gtt SUP - protonix Nutrition - tube feeds Goals of care - full code  D/w Dr. Marisue Humble  CC time 33 minutes  Coralyn Helling, MD [facility] Pulmonary/Critical Care 12/07/2017, 8:47 AM

## 2017-12-08 ENCOUNTER — Inpatient Hospital Stay (HOSPITAL_COMMUNITY): Payer: Medicare Other

## 2017-12-08 LAB — CBC
HCT: 30 % — ABNORMAL LOW (ref 36.0–46.0)
Hemoglobin: 8.6 g/dL — ABNORMAL LOW (ref 12.0–15.0)
MCH: 27.6 pg (ref 26.0–34.0)
MCHC: 28.7 g/dL — AB (ref 30.0–36.0)
MCV: 96.2 fL (ref 78.0–100.0)
PLATELETS: 174 10*3/uL (ref 150–400)
RBC: 3.12 MIL/uL — ABNORMAL LOW (ref 3.87–5.11)
RDW: 19.9 % — AB (ref 11.5–15.5)
WBC: 22.5 10*3/uL — ABNORMAL HIGH (ref 4.0–10.5)

## 2017-12-08 LAB — RENAL FUNCTION PANEL
ANION GAP: 10 (ref 5–15)
Albumin: 2.9 g/dL — ABNORMAL LOW (ref 3.5–5.0)
Albumin: 2.5 g/dL — ABNORMAL LOW (ref 3.5–5.0)
Anion gap: 9 (ref 5–15)
BUN: 24 mg/dL — ABNORMAL HIGH (ref 6–20)
BUN: 35 mg/dL — AB (ref 6–20)
CALCIUM: 9.1 mg/dL (ref 8.9–10.3)
CO2: 26 mmol/L (ref 22–32)
CO2: 29 mmol/L (ref 22–32)
CREATININE: 0.75 mg/dL (ref 0.44–1.00)
Calcium: 9.4 mg/dL (ref 8.9–10.3)
Chloride: 102 mmol/L (ref 101–111)
Chloride: 104 mmol/L (ref 101–111)
Creatinine, Ser: 1.19 mg/dL — ABNORMAL HIGH (ref 0.44–1.00)
GFR calc Af Amer: 52 mL/min — ABNORMAL LOW (ref >60)
GFR calc non Af Amer: 45 mL/min — ABNORMAL LOW (ref >60)
GLUCOSE: 192 mg/dL — AB (ref 65–99)
Glucose, Bld: 154 mg/dL — ABNORMAL HIGH (ref 65–99)
PHOSPHORUS: 2 mg/dL — AB (ref 2.5–4.6)
PHOSPHORUS: 1.9 mg/dL — AB (ref 2.5–4.6)
POTASSIUM: 3.7 mmol/L (ref 3.5–5.1)
Potassium: 3.6 mmol/L (ref 3.5–5.1)
SODIUM: 138 mmol/L (ref 135–145)
SODIUM: 142 mmol/L (ref 135–145)

## 2017-12-08 LAB — GLUCOSE, CAPILLARY
GLUCOSE-CAPILLARY: 182 mg/dL — AB (ref 65–99)
GLUCOSE-CAPILLARY: 190 mg/dL — AB (ref 65–99)
GLUCOSE-CAPILLARY: 201 mg/dL — AB (ref 65–99)
GLUCOSE-CAPILLARY: 219 mg/dL — AB (ref 65–99)
Glucose-Capillary: 144 mg/dL — ABNORMAL HIGH (ref 65–99)
Glucose-Capillary: 196 mg/dL — ABNORMAL HIGH (ref 65–99)

## 2017-12-08 LAB — POCT I-STAT 3, ART BLOOD GAS (G3+)
ACID-BASE EXCESS: 1 mmol/L (ref 0.0–2.0)
BICARBONATE: 26.2 mmol/L (ref 20.0–28.0)
O2 Saturation: 88 %
PCO2 ART: 44.1 mmHg (ref 32.0–48.0)
PO2 ART: 56 mmHg — AB (ref 83.0–108.0)
TCO2: 28 mmol/L (ref 22–32)
pH, Arterial: 7.381 (ref 7.350–7.450)

## 2017-12-08 LAB — PROTIME-INR
INR: 1.14
PROTHROMBIN TIME: 14.5 s (ref 11.4–15.2)

## 2017-12-08 LAB — MAGNESIUM: MAGNESIUM: 2.5 mg/dL — AB (ref 1.7–2.4)

## 2017-12-08 LAB — HEPARIN LEVEL (UNFRACTIONATED)
Heparin Unfractionated: 0.13 IU/mL — ABNORMAL LOW (ref 0.30–0.70)
Heparin Unfractionated: 0.32 IU/mL (ref 0.30–0.70)

## 2017-12-08 MED ORDER — QUETIAPINE FUMARATE 50 MG PO TABS
50.0000 mg | ORAL_TABLET | Freq: Two times a day (BID) | ORAL | Status: DC
  Administered 2017-12-08 – 2017-12-14 (×14): 50 mg
  Filled 2017-12-08 (×14): qty 1

## 2017-12-08 MED ORDER — TRAZODONE HCL 50 MG PO TABS
100.0000 mg | ORAL_TABLET | Freq: Every day | ORAL | Status: DC
  Administered 2017-12-08 – 2017-12-14 (×7): 100 mg
  Filled 2017-12-08 (×7): qty 2

## 2017-12-08 MED ORDER — WARFARIN SODIUM 6 MG PO TABS
6.0000 mg | ORAL_TABLET | Freq: Once | ORAL | Status: AC
  Administered 2017-12-08: 6 mg via ORAL
  Filled 2017-12-08: qty 1

## 2017-12-08 MED ORDER — WARFARIN - PHARMACIST DOSING INPATIENT
Freq: Every day | Status: DC
  Administered 2017-12-14: 18:00:00

## 2017-12-08 MED ORDER — METOPROLOL TARTRATE 5 MG/5ML IV SOLN
2.5000 mg | Freq: Four times a day (QID) | INTRAVENOUS | Status: DC | PRN
  Administered 2017-12-09: 2.5 mg via INTRAVENOUS
  Filled 2017-12-08: qty 5

## 2017-12-08 NOTE — Progress Notes (Signed)
ANTICOAGULATION CONSULT NOTE  Pharmacy Consult for warfarin Indication: atrial fibrillation  Patient Measurements: Height: 5\' 6"  (167.6 cm)(Per [facility] documents) Weight: 152 lb 12.5 oz (69.3 kg) IBW/kg (Calculated) : 59.3 Heparin Dosing Weight: 75.8 kg  Vital Signs: Temp: 98.5 F (36.9 C) (06/15 0730) Temp Source: Oral (06/15 0730) BP: 80/60 (06/15 0645) Pulse Rate: 117 (06/15 0600)  Labs: Recent Labs    12/06/17 0426  12/06/17 0427 12/06/17 1522 12/07/17 0434 12/07/17 0435 12/07/17 0814 12/08/17 0334 12/08/17 0500  HGB 8.5*  --   --   --  8.2*  --   --  8.6*  --   HCT 29.4*  --   --   --  28.9*  --   --  30.0*  --   PLT 227  --   --   --  198  --   --  174  --   LABPROT  --   --  16.0*  --  16.8*  --   --  14.5  --   INR  --   --  1.29  --  1.37  --   --  1.14  --   HEPARINUNFRC  --    < > 0.51  --   --  2.16* 0.32 0.13*  --   CREATININE 0.77  --   --  0.70 0.82  --   --   --  0.75   < > = values in this interval not displayed.   Assessment: 72 yo female previously on Eliquis and was recently transitioned to warfarin - as documented in shadow chart. INR remained therapeutic for several days without receiving any anticoagulation. Heparin was initiated when INR fell < 2, now to resume warfarin per Rx. INR 1.14, home warfarin dose 6mg  daily per records.  Goal of Therapy:  INR 2-3 Heparin level 0.3-0.7 units/ml Monitor platelets by anticoagulation protocol: Yes   Plan:  -Warfarin 6mg  PO x1 -Daily protime  Fredonia HighlandMichael Bitonti, PharmD, BCPS PGY-2 Cardiology Pharmacy Resident Phone: [phone] 12/08/2017

## 2017-12-08 NOTE — Progress Notes (Signed)
ANTICOAGULATION CONSULT NOTE  Pharmacy Consult for heparin Indication: atrial fibrillation  Patient Measurements: Height: 5\' 6"  (167.6 cm)([facility]) Weight: 152 lb 12.5 oz (69.3 kg) IBW/kg (Calculated) : 59.3 Heparin Dosing Weight: 75.8 kg  Vital Signs: Temp: 100.9 F (38.3 C) (06/15 1210) Temp Source: Oral (06/15 1210) BP: 113/64 (06/15 1300) Pulse Rate: 132 (06/15 1300)  Labs: Recent Labs    12/06/17 0426 12/06/17 0427 12/06/17 1522 12/07/17 0434  12/07/17 0814 12/08/17 0334 12/08/17 0500 12/08/17 1100  HGB 8.5*  --   --  8.2*  --   --  8.6*  --   --   HCT 29.4*  --   --  28.9*  --   --  30.0*  --   --   PLT 227  --   --  198  --   --  174  --   --   LABPROT  --  16.0*  --  16.8*  --   --  14.5  --   --   INR  --  1.29  --  1.37  --   --  1.14  --   --   HEPARINUNFRC  --  0.51  --   --    < > 0.32 0.13*  --  0.32  CREATININE 0.77  --  0.70 0.82  --   --   --  0.75  --    < > = values in this interval not displayed.   Assessment: 72 yo female previously on Eliquis and was recently transitioned to warfarin - as documented in shadow chart. INR remained therapeutic for several days without receiving any anticoagulation. Heparin was initiated when INR fell < 2. Heparin level now therapeutic at 0.32.   Goal of Therapy:  Heparin level 0.3-0.7 units/ml Monitor platelets by anticoagulation protocol: Yes   Plan:  Continue heparin gtt 1050 units/hr Daily heparin level and CBC  Thanks for allowing pharmacy to be a part of this patient's care.  Lysle Pearlachel Pranavi Aure, PharmD, BCPS Phone #: [phone] until 3pm All other times, call Main Pharmacy x [phone] 12/08/2017 1:28 PM

## 2017-12-08 NOTE — Procedures (Signed)
Admit: 11/30/2017 LOS: 8  77F ESRD admit from [place] for hypotension, VDRF/Hypervolemia/Pulm Edema, inability to UF with iHD  Current CRRT Prescription: Start Date: 12/01/17 Catheter: R IJ Tunneled HD Cath BFR: 150 Pre Blood Pump: 500 4K DFR: 1500 4K Replacement Rate: 200 4K Goal UF: 122m/hr net engative Anticoagulation: none in circuit, systemic heparin Clotting: infrequent  S: Weened off NE this AM Weight down 41lb from admission   O: 06/14 0701 - 06/15 0700 In: 2079.3 [I.V.:879.3; NG/GT:1080] Out: 3361 [Stool:400]   Chronically Ill Appearing, intubated R IJ Tunneled HD cath noted Nl S1s2, no rub IRIR Coarse BS b/l Trace LEE   Filed Weights   12/06/17 0427 12/07/17 0445 12/08/17 0500  Weight: 75.3 kg (166 lb 0.1 oz) 70.7 kg (155 lb 13.8 oz) 69.3 kg (152 lb 12.5 oz)    Recent Labs  Lab 12/06/17 1522 12/07/17 0434 12/08/17 0500  NA 139 135 138  K 3.9 3.4* 3.6  CL 102 105 102  CO2 _0 GLUCOSE 165* 235* 154*  BUN 29* 26* 24*  CREATININE 0.70 0.82 0.75  CALCIUM 9.1 8.1* 9.1  PHOS 1.6* 1.5* 2.0*   Recent Labs  Lab 12/02/17 0420 12/03/17 0500  12/06/17 0426 12/07/17 0434 12/08/17 0334  WBC 15.7* 14.9*   < > 14.9* 20.6* 22.5*  NEUTROABS 13.5* 12.3*  --   --   --   --   HGB 9.1* 8.7*   < > 8.5* 8.2* 8.6*  HCT 31.6* 31.8*   < > 29.4* 28.9* 30.0*  MCV 98.8 102.6*   < > 98.3 99.0 96.2  PLT 207 205   < > 227 198 174   < > = values in this interval not displayed.    Scheduled Meds: . atorvastatin  80 mg Per Tube q1800  . chlorhexidine gluconate (MEDLINE KIT)  15 mL Mouth Rinse BID  . Chlorhexidine Gluconate Cloth  6 each Topical Daily  . darbepoetin (ARANESP) injection - NON-DIALYSIS  60 mcg Subcutaneous Q Sat-1800  . escitalopram  20 mg Per Tube Daily  . feeding supplement (PRO-STAT SUGAR FREE 64)  30 mL Per Tube 5 X Daily  . folic acid  1 mg Per Tube Daily  . hydrocortisone sod succinate (SOLU-CORTEF) inj  50 mg Intravenous Q6H  . insulin aspart   2-6 Units Subcutaneous Q4H  . mouth rinse  15 mL Mouth Rinse 10 times per day  . midodrine  10 mg Oral TID WC  . pantoprazole sodium  40 mg Per Tube Daily  . QUEtiapine  50 mg Per Tube BID  . thiamine  100 mg Per Tube Daily  . traZODone  100 mg Per Tube QHS  . warfarin  6 mg Oral ONCE-1800  . Warfarin - Pharmacist Dosing Inpatient   Does not apply q1800   Continuous Infusions: . sodium chloride 10 mL/hr at 12/08/17 0800  . dexmedetomidine (PRECEDEX) IV infusion 0.4 mcg/kg/hr (12/08/17 0800)  . feeding supplement (VITAL AF 1.2 CAL) 45 mL/hr at 12/08/17 0800  . heparin 1,050 Units/hr (12/08/17 0800)  . heparin 999 mL/hr at 11/30/17 2354  . norepinephrine (LEVOPHED) Adult infusion Stopped (12/08/17 0531)  . dialysis replacement fluid (prismasate) 500 mL/hr at 12/08/17 0126  . dialysis replacement fluid (prismasate) 200 mL/hr at 12/07/17 1516  . dialysate (PRISMASATE) 1,500 mL/hr at 12/08/17 0410   PRN Meds:.sodium chloride, docusate, fentaNYL (SUBLIMAZE) injection, heparin, heparin, ipratropium-albuterol, magnesium hydroxide, metoprolol tartrate, midazolam  ABG    Component Value Date/Time   PHART  7.381 12/08/2017 0605   PCO2ART 44.1 12/08/2017 0605   PO2ART 56.0 (L) 12/08/2017 0605   HCO3 26.2 12/08/2017 0605   TCO2 28 12/08/2017 0605   ACIDBASEDEF 2.0 12/04/2017 0535   O2SAT 88.0 12/08/2017 0605    A 1. ESRD, recent start of HD, has R IJ tunneled HD cath 2. Hypervolemia/Pulmonoary Edema/VDRF/COPD; trach 6/12; weight down 40lb from admission 3. Anemia of CKD; recent IV Fe, on aranesp 45mg qSat; monitoring 4. Hypotension, ? Septic shock off vasopressors; onmidodrine 5. AFib on anticoagulation, systemic heparin, warfarin on hold 6. Hx/o CVA with L sided hemiplegia, PEG tube  7. DM2 8. CKDBMD 9. Hypophosphatemia intermittently repleting   P  Off NE, stop CRRT and monitor  Will see if she can tol iHD; if not need to discuss with family that she is too sick for  dialysis  Cont Midodrine Medication Issues: Preferred narcotic agents for pain control are hydromorphone, fentanyl, and methadone. Morphine should not be used. Oxydone is not preferred.  Baclofen should not be used. Avoid Fleets and Magnesium Citrate Bowel Preps   RPearson Grippe MD [facility] pgr [phone]

## 2017-12-08 NOTE — Progress Notes (Signed)
ANTICOAGULATION CONSULT NOTE  Pharmacy Consult for heparin Indication: atrial fibrillation  Patient Measurements: Height: 5\' 6"  (167.6 cm)([facility]) Weight: 155 lb 13.8 oz (70.7 kg) IBW/kg (Calculated) : 59.3 Heparin Dosing Weight: 75.8 kg  Vital Signs: Temp: 98.4 F (36.9 C) (06/14 2328) Temp Source: Oral (06/14 2328) BP: 109/62 (06/15 0415) Pulse Rate: 125 (06/15 0400)  Labs: Recent Labs    12/05/17 0434  12/06/17 0426 12/06/17 0427 12/06/17 1522 12/07/17 0434 12/07/17 0435 12/07/17 0814 12/08/17 0334  HGB 8.7*  --  8.5*  --   --  8.2*  --   --  8.6*  HCT 30.8*  --  29.4*  --   --  28.9*  --   --  30.0*  PLT 244  --  227  --   --  198  --   --  174  LABPROT 18.2*  --   --  16.0*  --  16.8*  --   --   --   INR 1.52  --   --  1.29  --  1.37  --   --   --   HEPARINUNFRC 0.34  --   --  0.51  --   --  2.16* 0.32 0.13*  CREATININE 0.80  0.85   < > 0.77  --  0.70 0.82  --   --   --    < > = values in this interval not displayed.   Assessment: 72 yo female previously on Eliquis and was recently transitioned to warfarin - as documented in shadow chart. INR remained therapeutic for several days without receiving any anticoagulation. Heparin was initiated when INR fell < 2. Heparin level now 0.13 units/hr  Goal of Therapy:  Heparin level 0.3-0.7 units/ml Monitor platelets by anticoagulation protocol: Yes   Plan:  Increase heparin gtt to 1050 units/hr Check heparin level later today Daily heparin level and CBC  Thanks for allowing pharmacy to be a part of this patient's care.  Talbert CageLora Fanta Wimberley, PharmD Clinical Pharmacist

## 2017-12-08 NOTE — Progress Notes (Signed)
PULMONARY / CRITICAL CARE MEDICINE   Name: Sheila Cabrera MRN: [id number] DOB: 01/06/1946    ADMISSION DATE:  11/30/2017 CONSULTATION DATE:  11/30/2017  [facility] MD[facility]  [facility]   CHIEF COMPLAINT:  Respiratory Distress   HISTORY OF PRESENT ILLNESS:   72 yo female present to [facility] 10/29/17 with hypoxia and altered mental status from PNA requiring intubation, and transferred to [facility].  Course complicated by GI bleed.  Developed acute b/l cortical strokes while off eliquis.  Transferred to [facility] 5/31, and was using Bipap.  Developed worsening respiratory distress and intubated on 11/30/17.  She was not able to tolerate intermittent HD and transferred to [facility] for CRRT.  PMHx of DM, COPD, A fib, HFpEF, ESRD, CVA with Lt hemiplegia, dysphagia.  SUBJECTIVE:  Intermittently on pressors and precedex overnight.  Bradycardia with suctioning.  VITAL SIGNS: BP (!) 80/60   Pulse (!) 117   Temp 98.4 F (36.9 C) (Oral)   Resp (!) 29   Ht 5\' 6"  (1.676 m) Comment: [facility]  Wt 152 lb 12.5 oz (69.3 kg)   SpO2 99%   BMI 24.66 kg/m   VENTILATOR SETTINGS: Vent Mode: PRVC FiO2 (%):  [40 %-60 %] 60 % Set Rate:  [26 bmp] 26 bmp Vt Set:  [470 mL-570 mL] 470 mL PEEP:  [5 cmH20] 5 cmH20 Plateau Pressure:  [16 cmH20-19 cmH20] 19 cmH20  INTAKE / OUTPUT: I/O last 3 completed shifts: In: 3054 [I.V.:1452.5; Other:240; NG/GT:1361.5] Out: 5621 [HYQMV:7846; Stool:700]  PHYSICAL EXAMINATION:  General - alert Eyes - pupils reactive ENT - trach site clean Cardiac - regular, no murmur Chest - no wheeze, rales Abd - soft, non tender Ext - no edema Skin - no rashes Neuro - follows simple commands  LABS:  BMET Recent Labs  Lab 12/06/17 1522 12/07/17 0434 12/08/17 0500  NA 139 135 138  K 3.9 3.4* 3.6  CL 102 105 102  CO2 27 23 26   BUN 29* 26* 24*  CREATININE 0.70 0.82 0.75  GLUCOSE 165* 235* 154*   Electrolytes Recent Labs  Lab 12/06/17 0426 12/06/17 1522  12/07/17 0434 12/08/17 0334 12/08/17 0500  CALCIUM 9.0 9.1 8.1*  --  9.1  MG 2.7*  --  2.2 2.5*  --   PHOS 2.4* 1.6* 1.5*  --  2.0*   CBC Recent Labs  Lab 12/06/17 0426 12/07/17 0434 12/08/17 0334  WBC 14.9* 20.6* 22.5*  HGB 8.5* 8.2* 8.6*  HCT 29.4* 28.9* 30.0*  PLT 227 198 174   Coag's Recent Labs  Lab 12/06/17 0427 12/07/17 0434 12/08/17 0334  INR 1.29 1.37 1.14   Sepsis Markers Recent Labs  Lab 12/02/17 0420  PROCALCITON 0.82   ABG Recent Labs  Lab 12/05/17 0510 12/06/17 0516 12/08/17 0605  PHART 7.337* 7.342* 7.381  PCO2ART 48.5* 48.6* 44.1  PO2ART 104 70.0* 56.0*   Liver Enzymes Recent Labs  Lab 12/06/17 0426 12/06/17 1522 12/07/17 0434 12/08/17 0500  AST 29  --   --   --   ALT 16  --   --   --   ALKPHOS 94  --   --   --   BILITOT 0.6  --   --   --   ALBUMIN 2.9* 2.8* 2.6* 2.9*   Cardiac Enzymes Recent Labs  Lab 12/01/17 1302  TROPONINI 0.05*   Glucose Recent Labs  Lab 12/07/17 0738 12/07/17 1127 12/07/17 1610 12/07/17 1945 12/07/17 2354 12/08/17 0339  GLUCAP 169* 194* 212* 151* 201* 144*  Imaging Dg Chest Port 1 View  Result Date: 12/08/2017 CLINICAL DATA:  Respiratory failure EXAM: PORTABLE CHEST 1 VIEW COMPARISON:  12/07/2017 FINDINGS: Support devices are stable. Heart is upper limits normal in size. No visible pneumothorax currently. Improving aeration in the lung bases with decreasing bibasilar opacities. No visible effusions. IMPRESSION: Improving bibasilar opacities, likely atelectasis. Electronically Signed   By: Charlett NoseKevin  Dover M.D.   On: 12/08/2017 07:15    STUDIES:  Echo 6/08 >> EF 65 to 70%, grade 1 DD  CULTURES: Blood 6/7 >> negative Sputum 6/7 >> Proteus  ANTIBIOTICS: 6/08 Zosyn>> 6/10 6/08 vancomycin>> 6/10 6/11 Rocephin >> 6/14  SIGNIFICANT EVENTS: [facility]  6/08 started on dobutamine drip, cardiology consulted  LINES/TUBES: CVC Left IJ 6/7 (OSH) >>  ETT 6/6 (OSH) >> 6/12 Right  IJ Tunneled HD >>  Trach 6/12 >>  DISCUSSION: 72 yo female with VDRF from HCAP with Proteus complicated by septic shock.  Failure to wean s/p trach.  Inability to tolerate intermittent HD due to bradycardia and low blood pressure, so remains on CRRT.  ASSESSMENT / PLAN:  Acute respiratory failure. Failure to wean s/p trach. Hx of COPD. - pressure support wean as able - f/u CXR intermittently - prn BDs  Rt basilar PTX. - likely from posterior membrane leak after perc trach - resolved on CXR 6/15  Septic shock. - off pressors in AM 6/15 - solu cortef started 6/14  HCAP with proteus. - ABx completed 6/14  HFpEF, A fib, chronic hypotension, HLD. - heparin gtt >> pharmacy to transition to coumadin - continue midodrine 10 mg tid - continue lipitor - prn lopressor for HR > 115  ESRD. - renal planning to transition off CRRT and switch to intermittent HD 6/15  Anemia of critical illness and chronic disease. - f/u CBC - continue aranesp  DM type II. - SSI  Dysphagia. - PEG feedings  Acute metabolic encephalopathy. Hx of CVA with Lt hemiplegia, depression/anxiety. - wean off precedex as able to keep RASS 0 to -1 - change seroquel to 50 mg bid, trazodone to 100 mg qhs - continue lexapro  DVT prophylaxis - heparin gtt SUP - protonix Nutrition - tube feeds Goals of care - full code  D/w Dr. Marisue HumbleSanford  CC time 32 minutes  Coralyn HellingVineet Zakkiyya Barno, MD [facility] Pulmonary/Critical Care 12/08/2017, 7:28 AM

## 2017-12-08 NOTE — Progress Notes (Signed)
This note also relates to the following rows which could not be included: SpO2 - Cannot attach notes to unvalidated device data  SBT terminated this time and patient placed back on full support due to increased HR. RT will continue to monitor.

## 2017-12-09 LAB — RENAL FUNCTION PANEL
ANION GAP: 11 (ref 5–15)
Albumin: 2.6 g/dL — ABNORMAL LOW (ref 3.5–5.0)
BUN: 58 mg/dL — ABNORMAL HIGH (ref 6–20)
CALCIUM: 9.3 mg/dL (ref 8.9–10.3)
CHLORIDE: 102 mmol/L (ref 101–111)
CO2: 27 mmol/L (ref 22–32)
Creatinine, Ser: 1.81 mg/dL — ABNORMAL HIGH (ref 0.44–1.00)
GFR calc non Af Amer: 27 mL/min — ABNORMAL LOW (ref >60)
GFR, EST AFRICAN AMERICAN: 32 mL/min — AB (ref >60)
GLUCOSE: 240 mg/dL — AB (ref 65–99)
Phosphorus: 2.6 mg/dL (ref 2.5–4.6)
Potassium: 3.6 mmol/L (ref 3.5–5.1)
SODIUM: 140 mmol/L (ref 135–145)

## 2017-12-09 LAB — CBC
HEMATOCRIT: 26.5 % — AB (ref 36.0–46.0)
HEMOGLOBIN: 7.6 g/dL — AB (ref 12.0–15.0)
MCH: 28.4 pg (ref 26.0–34.0)
MCHC: 28.7 g/dL — AB (ref 30.0–36.0)
MCV: 98.9 fL (ref 78.0–100.0)
Platelets: 182 10*3/uL (ref 150–400)
RBC: 2.68 MIL/uL — ABNORMAL LOW (ref 3.87–5.11)
RDW: 19.9 % — ABNORMAL HIGH (ref 11.5–15.5)
WBC: 22.2 10*3/uL — ABNORMAL HIGH (ref 4.0–10.5)

## 2017-12-09 LAB — GLUCOSE, CAPILLARY
GLUCOSE-CAPILLARY: 212 mg/dL — AB (ref 65–99)
GLUCOSE-CAPILLARY: 216 mg/dL — AB (ref 65–99)
GLUCOSE-CAPILLARY: 231 mg/dL — AB (ref 65–99)
GLUCOSE-CAPILLARY: 232 mg/dL — AB (ref 65–99)
GLUCOSE-CAPILLARY: 241 mg/dL — AB (ref 65–99)
Glucose-Capillary: 209 mg/dL — ABNORMAL HIGH (ref 65–99)
Glucose-Capillary: 245 mg/dL — ABNORMAL HIGH (ref 65–99)

## 2017-12-09 LAB — PROTIME-INR
INR: 1.21
PROTHROMBIN TIME: 15.2 s (ref 11.4–15.2)

## 2017-12-09 LAB — MAGNESIUM: Magnesium: 2.5 mg/dL — ABNORMAL HIGH (ref 1.7–2.4)

## 2017-12-09 LAB — HEPARIN LEVEL (UNFRACTIONATED)
HEPARIN UNFRACTIONATED: 0.76 [IU]/mL — AB (ref 0.30–0.70)
Heparin Unfractionated: 0.31 IU/mL (ref 0.30–0.70)

## 2017-12-09 MED ORDER — MIDODRINE HCL 5 MG PO TABS
10.0000 mg | ORAL_TABLET | Freq: Three times a day (TID) | ORAL | Status: DC
  Administered 2017-12-09 – 2017-12-15 (×15): 10 mg via ORAL
  Filled 2017-12-09 (×15): qty 2

## 2017-12-09 MED ORDER — SODIUM CHLORIDE 0.9 % IV SOLN
1.0000 g | INTRAVENOUS | Status: DC
  Administered 2017-12-09 – 2017-12-11 (×3): 1 g via INTRAVENOUS
  Filled 2017-12-09: qty 1
  Filled 2017-12-09: qty 10
  Filled 2017-12-09: qty 1
  Filled 2017-12-09: qty 10

## 2017-12-09 MED ORDER — ACETAMINOPHEN 160 MG/5ML PO SOLN
650.0000 mg | Freq: Four times a day (QID) | ORAL | Status: DC | PRN
  Administered 2017-12-09 – 2017-12-11 (×2): 650 mg
  Filled 2017-12-09 (×2): qty 20.3

## 2017-12-09 MED ORDER — WARFARIN SODIUM 6 MG PO TABS
6.0000 mg | ORAL_TABLET | Freq: Once | ORAL | Status: AC
  Administered 2017-12-09: 6 mg via ORAL
  Filled 2017-12-09: qty 1

## 2017-12-09 NOTE — Progress Notes (Signed)
ANTICOAGULATION CONSULT NOTE  Pharmacy Consult for heparin Indication: atrial fibrillation  Patient Measurements: Height: 5\' 6"  (167.6 cm)([facility]) Weight: 152 lb 12.5 oz (69.3 kg) IBW/kg (Calculated) : 59.3 Heparin Dosing Weight: 75.8 kg  Vital Signs: Temp: 99.7 F (37.6 C) (06/16 0400) Temp Source: Oral (06/16 0400) BP: 109/53 (06/16 0500) Pulse Rate: 84 (06/16 0500)  Labs: Recent Labs    12/07/17 0434  12/08/17 0334 12/08/17 0500 12/08/17 1100 12/08/17 1509 12/09/17 0419  HGB 8.2*  --  8.6*  --   --   --  7.6*  HCT 28.9*  --  30.0*  --   --   --  26.5*  PLT 198  --  174  --   --   --  182  LABPROT 16.8*  --  14.5  --   --   --  15.2  INR 1.37  --  1.14  --   --   --  1.21  HEPARINUNFRC  --    < > 0.13*  --  0.32  --  0.76*  CREATININE 0.82  --   --  0.75  --  1.19*  --    < > = values in this interval not displayed.   Assessment: 72 yo female previously on Eliquis and was recently transitioned to warfarin - as documented in shadow chart. INR remained therapeutic for several days without receiving any anticoagulation. Heparin was initiated when INR fell < 2. Heparin level this am slightly supratherapeutic 0.76 units/ml  Goal of Therapy:  Heparin level 0.3-0.7 units/ml Monitor platelets by anticoagulation protocol: Yes   Plan:  Decrease heparin gtt 950 units/hr Check heparin level in ~ 6-8 hours Daily heparin level and CBC  Thanks for allowing pharmacy to be a part of this patient's care.  Talbert CageLora Aidenjames Heckmann, PharmD

## 2017-12-09 NOTE — Progress Notes (Signed)
eLink Physician-Brief Progress Note Patient Name: Shaaron Adlervelyn Castanon DOB: [birth year] MRN: [id number]   Date of Service  12/09/2017  HPI/Events of Note  Fever to 101.3 - Patient finished 7 day course of antibiotics for Proteus on sputum culture on 6/14. Will restart Rocephin. AST and ALT both normal.   eICU Interventions  Will order: 1. Rocephin 1 gm IV now and Q 12 hours.  2. Blood cultures X 2.  3. Tracheal aspirate culture now.  4. Tylenol liquid 650 mg per tube Q 6 hours PRN Temp > 100.5 F.      Intervention Category Major Interventions: Infection - evaluation and management  Sommer,Steven Eugene 12/09/2017, 1:17 AM

## 2017-12-09 NOTE — Progress Notes (Signed)
ANTICOAGULATION CONSULT NOTE  Pharmacy Consult for heparin + warfarin Indication: atrial fibrillation  Patient Measurements: Height: 5\' 6"  (167.6 cm)(Per [facility] documents) Weight: 157 lb 3 oz (71.3 kg) IBW/kg (Calculated) : 59.3 Heparin Dosing Weight: 75.8 kg  Vital Signs: Temp: 99.5 F (37.5 C) (06/16 1200) Temp Source: Oral (06/16 1200) BP: 86/46 (06/16 1500) Pulse Rate: 102 (06/16 1134)  Labs: Recent Labs    12/07/17 0434  12/08/17 0334 12/08/17 0500 12/08/17 1100 12/08/17 1509 12/09/17 0419 12/09/17 1419  HGB 8.2*  --  8.6*  --   --   --  7.6*  --   HCT 28.9*  --  30.0*  --   --   --  26.5*  --   PLT 198  --  174  --   --   --  182  --   LABPROT 16.8*  --  14.5  --   --   --  15.2  --   INR 1.37  --  1.14  --   --   --  1.21  --   HEPARINUNFRC  --    < > 0.13*  --  0.32  --  0.76* 0.31  CREATININE 0.82  --   --  0.75  --  1.19* 1.81*  --    < > = values in this interval not displayed.   Assessment: 72 yo female previously on Eliquis and was recently transitioned to warfarin - as documented in shadow chart. INR remained therapeutic for several days without receiving any anticoagulation. Heparin was initiated when INR fell < 2. Heparin level is now therapeutic after dose reduction. INR remains subtherapeutic as expected.   Goal of Therapy:  Heparin level 0.3-0.7 units/ml Monitor platelets by anticoagulation protocol: Yes   Plan:  Continue heparin gtt 950 units/hr Warfarin 6mg  PO x 1 tonight Daily INR, heparin level and CBC  Thanks for allowing pharmacy to be a part of this patient's care.  Lysle Pearlachel Gaspare Netzel, PharmD, BCPS Phone #: [phone] until 3pm All other times, call Main Pharmacy x [phone] 12/09/2017 3:19 PM

## 2017-12-09 NOTE — Progress Notes (Signed)
PULMONARY / CRITICAL CARE MEDICINE   Name: Sheila Cabrera MRN: [id number] DOB: May 14, 1946    ADMISSION DATE:  11/30/2017 CONSULTATION DATE:  11/30/2017  [facility]ING MD:  [facility]   CHIEF COMPLAINT:  Respiratory Distress   HISTORY OF PRESENT ILLNESS:   72 yo female [facility] with hypoxia and altered mental status from PNA requiring intubation, and [facility].  Course complicated by GI bleed.  Developed acute b/l cortical strokes while off eliquis.  Transferred to [facility] 5/31, and was using Bipap.  Developed worsening respiratory distress and intubated on 11/30/17.  She was not able to tolerate intermittent HD and [facility].  PMHx of DM, COPD, A fib, HFpEF, ESRD, CVA with Lt hemiplegia, dysphagia.  SUBJECTIVE:  Fever overnight and back on pressors.  ABx restarted.  VITAL SIGNS: BP (!) 123/55   Pulse 85   Temp 99.7 F (37.6 C) (Oral)   Resp (!) 21   Ht 5\' 6"  (1.676 m) Comment: Per [facility] documents  Wt 157 lb 3 oz (71.3 kg)   SpO2 100%   BMI 25.37 kg/m   VENTILATOR SETTINGS: Vent Mode: PRVC FiO2 (%):  [40 %] 40 % Set Rate:  [24 bmp] 24 bmp Vt Set:  [470 mL] 470 mL PEEP:  [5 cmH20] 5 cmH20 Plateau Pressure:  [15 cmH20-16 cmH20] 15 cmH20  INTAKE / OUTPUT: I/O last 3 completed shifts: In: 2891.2 [I.V.:1056.2; Other:160; NG/GT:1575; IV Piggyback:100] Out: 2607 [Other:1557; Stool:1050]  PHYSICAL EXAMINATION:  General - sleepy Eyes - pupils reactive ENT - trach site clean Cardiac - regular, no murmur Chest - no wheeze, rales Abd - soft, non tender Ext - no edema Skin - no rashes Neuro - follow simple commands  LABS:  BMET Recent Labs  Lab 12/08/17 0500 12/08/17 1509 12/09/17 0419  NA 138 142 140  K 3.6 3.7 3.6  CL 102 104 102  CO2 26 29 27   BUN 24* 35* 58*  CREATININE 0.75 1.19* 1.81*  GLUCOSE 154* 192* 240*   Electrolytes Recent Labs  Lab 12/07/17 0434 12/08/17 0334 12/08/17 0500  12/08/17 1509 12/09/17 0419  CALCIUM 8.1*  --  9.1 9.4 9.3  MG 2.2 2.5*  --   --  2.5*  PHOS 1.5*  --  2.0* 1.9* 2.6   CBC Recent Labs  Lab 12/07/17 0434 12/08/17 0334 12/09/17 0419  WBC 20.6* 22.5* 22.2*  HGB 8.2* 8.6* 7.6*  HCT 28.9* 30.0* 26.5*  PLT 198 174 182   Coag's Recent Labs  Lab 12/07/17 0434 12/08/17 0334 12/09/17 0419  INR 1.37 1.14 1.21   Sepsis Markers No results for input(s): LATICACIDVEN, PROCALCITON, O2SATVEN in the last 168 hours. ABG Recent Labs  Lab 12/05/17 0510 12/06/17 0516 12/08/17 0605  PHART 7.337* 7.342* 7.381  PCO2ART 48.5* 48.6* 44.1  PO2ART 104 70.0* 56.0*   Liver Enzymes Recent Labs  Lab 12/06/17 0426  12/08/17 0500 12/08/17 1509 12/09/17 0419  AST 29  --   --   --   --   ALT 16  --   --   --   --   ALKPHOS 94  --   --   --   --   BILITOT 0.6  --   --   --   --   ALBUMIN 2.9*   < > 2.9* 2.5* 2.6*   < > = values in this interval not displayed.   Cardiac Enzymes No results for input(s): TROPONINI, PROBNP in the last 168  hours. Glucose Recent Labs  Lab 12/08/17 0726 12/08/17 1208 12/08/17 1633 12/08/17 1947 12/09/17 0014 12/09/17 0313  GLUCAP 196* 219* 182* 190* 212* 209*   Imaging No results found.  STUDIES:  Echo 6/08 >> EF 65 to 70%, grade 1 DD  CULTURES: Blood 6/7 >> negative Sputum 6/7 >> Proteus Blood 6/16 >> Sputum 6/16 >>   ANTIBIOTICS: 6/08 Zosyn>> 6/10 6/08 vancomycin>> 6/10 6/11 Rocephin >> 6/14 Rocephin 6/16 >>   SIGNIFICANT EVENTS: 6/07 Transported to [facility]  6/08 started on dobutamine drip, cardiology consulted 6/15 transition off CRRT 6/16 recurrent fever  LINES/TUBES: CVC Left IJ 6/7 (OSH) >>  ETT 6/6 (OSH) >> 6/12 Right IJ Tunneled HD >>  Trach 6/12 >>  DISCUSSION: 72 yo female with VDRF from HCAP with Proteus complicated by septic shock.  Failure to wean s/p trach.  Trying to transition off CRRT.  Has recurrent fever, and continuing pressor needs.  Not sure she will ever  tolerate intermittent HD.  If she can not, then will need to have goals of care discussion with family.  ASSESSMENT / PLAN:  Acute respiratory failure. Failure to wean s/p trach. Hx of COPD. - change to pressure control 6/16 - f/u CXR - prn BDs  Rt basilar PTX. - likely from posterior membrane leak after perc trach - resolved on CXR 6/15  Septic shock. - pressors to keep MAP > 65 - continue solu cortef  HCAP with proteus >> completed ABx 6/14.  Recurrent fever 6/16. - restarted rocephin 6/16 - f/u Cx results  HFpEF, A fib, chronic hypotension, HLD. - transition from heparin to coumadin per pharmacy - continue midodrine, lipitor - lopressor prn for HR > 115  ESRD. - CRRT not long term strategy - if hemodynamics don't improve, then she wouldn't be able to tolerate long term intermittent HD  Anemia of critical illness and chronic disease. -f/u CBC - continue aranesp  DM type II. - SSI  Dysphagia. - PEG feeds  Acute metabolic encephalopathy. Hx of CVA with Lt hemiplegia, depression/anxiety. - wean of precedex to keep RASS 0 - seroquel, trazodone, lexapro  DVT prophylaxis - heparin gtt SUP - protonix Nutrition - tube feeds Goals of care - full code  D/w Dr. Marisue Humble  CC time 31 minutes  Coralyn Helling, MD [facility] Pulmonary/Critical Care 12/09/2017, 7:38 AM

## 2017-12-09 NOTE — Progress Notes (Signed)
Admit: 11/30/2017 LOS: 9  79F ESRD admit from LTAC for hypotension, VDRF/Hypervolemia/Pulm Edema, inability to UF with iHD  Subjective:  . Fevers overnight, back on NE . Off CRRT currently . Potassium 3.6, BUN 58, creatinine 1.8 this morning.  06/15 0701 - 06/16 0700 In: 1912.3 [I.V.:737.3; NG/GT:1035; IV Piggyback:100] Out: 765 [Stool:650]  Filed Weights   12/07/17 0445 12/08/17 0500 12/09/17 0500  Weight: 70.7 kg (155 lb 13.8 oz) 69.3 kg (152 lb 12.5 oz) 71.3 kg (157 lb 3 oz)    Scheduled Meds: . atorvastatin  80 mg Per Tube q1800  . chlorhexidine gluconate (MEDLINE KIT)  15 mL Mouth Rinse BID  . Chlorhexidine Gluconate Cloth  6 each Topical Daily  . darbepoetin (ARANESP) injection - NON-DIALYSIS  60 mcg Subcutaneous Q Sat-1800  . escitalopram  20 mg Per Tube Daily  . feeding supplement (PRO-STAT SUGAR FREE 64)  30 mL Per Tube 5 X Daily  . folic acid  1 mg Per Tube Daily  . hydrocortisone sod succinate (SOLU-CORTEF) inj  50 mg Intravenous Q6H  . insulin aspart  2-6 Units Subcutaneous Q4H  . mouth rinse  15 mL Mouth Rinse 10 times per day  . midodrine  10 mg Oral TID WC  . pantoprazole sodium  40 mg Per Tube Daily  . QUEtiapine  50 mg Per Tube BID  . thiamine  100 mg Per Tube Daily  . traZODone  100 mg Per Tube QHS  . Warfarin - Pharmacist Dosing Inpatient   Does not apply q1800   Continuous Infusions: . sodium chloride 10 mL/hr at 12/09/17 0600  . cefTRIAXone (ROCEPHIN)  IV Stopped (12/09/17 0302)  . dexmedetomidine (PRECEDEX) IV infusion 0.4 mcg/kg/hr (12/09/17 0600)  . feeding supplement (VITAL AF 1.2 CAL) 45 mL/hr at 12/09/17 0600  . heparin 950 Units/hr (12/09/17 0600)  . heparin 999 mL/hr at 11/30/17 2354  . norepinephrine (LEVOPHED) Adult infusion 10 mcg/min (12/09/17 0647)  . dialysis replacement fluid (prismasate) 500 mL/hr at 12/08/17 0126  . dialysis replacement fluid (prismasate) 200 mL/hr at 12/07/17 1516  . dialysate (PRISMASATE) 1,500 mL/hr at 12/08/17  0410   PRN Meds:.sodium chloride, acetaminophen (TYLENOL) oral liquid 160 mg/5 mL, docusate, fentaNYL (SUBLIMAZE) injection, heparin, heparin, ipratropium-albuterol, magnesium hydroxide, metoprolol tartrate, midazolam  Current Labs: reviewed    Physical Exam:  Blood pressure (!) 123/55, pulse 85, temperature 99.7 F (37.6 C), temperature source Oral, resp. rate (!) 21, height 5' 6"  (1.676 m), weight 71.3 kg (157 lb 3 oz), SpO2 100 %. Chronically Ill Appearing, trach on vent R IJ Tunneled HD cath noted Nl S1s2, no rub IRIR Coarse BS b/l No LEE  A 1. ESRD, recent start of HD prior to admission at [facility], has R IJ tunneled HD cath 2. Hypervolemia/Pulmonoary Edema/VDRF/COPD; trach 6/12; weight down 40lb from admission 3. Anemia of CKD; recent IV Fe, on aranesp 35mg qSat; monitoring 4. Hypotension, ? Septic shock still requ vasopressors; on midodrine 5. AFib on anticoagulation, systemic heparin, warfarin on hold 6. Hx/o CVA with L sided hemiplegia, PEG tube  7. DM2 8. CKDBMD 9. Hypophosphatemia intermittently repleting while on CRRT  P . Little progress made over this week, pressors were stopped yesterday with hope of potential transition to intermittent hemodialysis but she has regressed overnight . I worry that we might not get much more improvement in her hemodynamics with chronic hypotension and pressor dependence.  So far ittle impact of midodrine.  If so then she really is not a candidate for long-term dialysis  as it would be too dangerous. . Reassess in 24h, after ABX and attempt to ween pressors . NO reason to resuem CRRT immediately today . Medication Issues; o Preferred narcotic agents for pain control are hydromorphone, fentanyl, and methadone. Morphine should not be used.  o Baclofen should be avoided o Avoid oral sodium phosphate and magnesium citrate based laxatives / bowel preps    Pearson Grippe MD 12/09/2017, 7:04 AM  Recent Labs  Lab 12/08/17 0500 12/08/17 1509  12/09/17 0419  NA 138 142 140  K 3.6 3.7 3.6  CL 102 104 102  CO2 26 29 27   GLUCOSE 154* 192* 240*  BUN 24* 35* 58*  CREATININE 0.75 1.19* 1.81*  CALCIUM 9.1 9.4 9.3  PHOS 2.0* 1.9* 2.6   Recent Labs  Lab 12/03/17 0500  12/07/17 0434 12/08/17 0334 12/09/17 0419  WBC 14.9*   < > 20.6* 22.5* 22.2*  NEUTROABS 12.3*  --   --   --   --   HGB 8.7*   < > 8.2* 8.6* 7.6*  HCT 31.8*   < > 28.9* 30.0* 26.5*  MCV 102.6*   < > 99.0 96.2 98.9  PLT 205   < > 198 174 182   < > = values in this interval not displayed.

## 2017-12-10 ENCOUNTER — Inpatient Hospital Stay (HOSPITAL_COMMUNITY): Payer: Medicare Other

## 2017-12-10 DIAGNOSIS — J189 Pneumonia, unspecified organism: Secondary | ICD-10-CM

## 2017-12-10 LAB — GLUCOSE, CAPILLARY
GLUCOSE-CAPILLARY: 230 mg/dL — AB (ref 65–99)
Glucose-Capillary: 248 mg/dL — ABNORMAL HIGH (ref 65–99)
Glucose-Capillary: 269 mg/dL — ABNORMAL HIGH (ref 65–99)
Glucose-Capillary: 252 mg/dL — ABNORMAL HIGH (ref 65–99)
Glucose-Capillary: 253 mg/dL — ABNORMAL HIGH (ref 65–99)
Glucose-Capillary: 174 mg/dL — ABNORMAL HIGH (ref 65–99)

## 2017-12-10 LAB — CBC
HEMATOCRIT: 26 % — AB (ref 36.0–46.0)
HEMOGLOBIN: 7.6 g/dL — AB (ref 12.0–15.0)
MCH: 28 pg (ref 26.0–34.0)
MCHC: 29.2 g/dL — ABNORMAL LOW (ref 30.0–36.0)
MCV: 95.9 fL (ref 78.0–100.0)
Platelets: 189 10*3/uL (ref 150–400)
RBC: 2.71 MIL/uL — ABNORMAL LOW (ref 3.87–5.11)
RDW: 19.2 % — AB (ref 11.5–15.5)
WBC: 25.3 10*3/uL — AB (ref 4.0–10.5)

## 2017-12-10 LAB — RENAL FUNCTION PANEL
ALBUMIN: 2.6 g/dL — AB (ref 3.5–5.0)
Anion gap: 11 (ref 5–15)
BUN: 105 mg/dL — ABNORMAL HIGH (ref 6–20)
CO2: 26 mmol/L (ref 22–32)
Calcium: 9.7 mg/dL (ref 8.9–10.3)
Chloride: 104 mmol/L (ref 101–111)
Creatinine, Ser: 2.8 mg/dL — ABNORMAL HIGH (ref 0.44–1.00)
GFR calc Af Amer: 19 mL/min — ABNORMAL LOW (ref >60)
GFR calc non Af Amer: 16 mL/min — ABNORMAL LOW (ref >60)
GLUCOSE: 263 mg/dL — AB (ref 65–99)
PHOSPHORUS: 3.4 mg/dL (ref 2.5–4.6)
Potassium: 3.9 mmol/L (ref 3.5–5.1)
SODIUM: 141 mmol/L (ref 135–145)

## 2017-12-10 LAB — MAGNESIUM: Magnesium: 2.5 mg/dL — ABNORMAL HIGH (ref 1.7–2.4)

## 2017-12-10 LAB — PROTIME-INR
INR: 1.17
Prothrombin Time: 14.8 seconds (ref 11.4–15.2)

## 2017-12-10 LAB — HEPARIN LEVEL (UNFRACTIONATED): Heparin Unfractionated: 0.27 IU/mL — ABNORMAL LOW (ref 0.30–0.70)

## 2017-12-10 MED ORDER — INSULIN ASPART 100 UNIT/ML ~~LOC~~ SOLN
0.0000 [IU] | SUBCUTANEOUS | Status: DC
  Administered 2017-12-10: 8 [IU] via SUBCUTANEOUS
  Administered 2017-12-10: 5 [IU] via SUBCUTANEOUS
  Administered 2017-12-10: 2 [IU] via SUBCUTANEOUS
  Administered 2017-12-10: 8 [IU] via SUBCUTANEOUS
  Administered 2017-12-11: 3 [IU] via SUBCUTANEOUS
  Administered 2017-12-11 (×2): 5 [IU] via SUBCUTANEOUS
  Administered 2017-12-11: 3 [IU] via SUBCUTANEOUS
  Administered 2017-12-11 – 2017-12-12 (×4): 5 [IU] via SUBCUTANEOUS
  Administered 2017-12-12: 2 [IU] via SUBCUTANEOUS
  Administered 2017-12-12 (×2): 5 [IU] via SUBCUTANEOUS
  Administered 2017-12-12: 2 [IU] via SUBCUTANEOUS
  Administered 2017-12-13 (×2): 3 [IU] via SUBCUTANEOUS

## 2017-12-10 MED ORDER — COLLAGENASE 250 UNIT/GM EX OINT
TOPICAL_OINTMENT | Freq: Every day | CUTANEOUS | Status: DC
  Administered 2017-12-10 – 2017-12-17 (×8): via TOPICAL
  Administered 2017-12-18 – 2017-12-19 (×2): 1 via TOPICAL
  Administered 2017-12-20: 11:00:00 via TOPICAL
  Filled 2017-12-10: qty 30

## 2017-12-10 MED ORDER — INSULIN GLARGINE 100 UNIT/ML ~~LOC~~ SOLN
20.0000 [IU] | Freq: Every day | SUBCUTANEOUS | Status: DC
  Administered 2017-12-10 – 2017-12-11 (×2): 20 [IU] via SUBCUTANEOUS
  Filled 2017-12-10 (×2): qty 0.2

## 2017-12-10 MED ORDER — WARFARIN SODIUM 7.5 MG PO TABS
7.5000 mg | ORAL_TABLET | Freq: Once | ORAL | Status: AC
  Administered 2017-12-10: 7.5 mg via ORAL
  Filled 2017-12-10: qty 1

## 2017-12-10 MED ORDER — SODIUM CHLORIDE 0.9 % IV SOLN
INTRAVENOUS | Status: DC
  Filled 2017-12-10: qty 1

## 2017-12-10 MED ORDER — WARFARIN SODIUM 6 MG PO TABS
6.0000 mg | ORAL_TABLET | Freq: Once | ORAL | Status: DC
  Filled 2017-12-10: qty 1

## 2017-12-10 NOTE — Progress Notes (Signed)
PULMONARY / CRITICAL CARE MEDICINE   Name: Sheila Cabrera MRN: [id number] DOB: Feb 11, 1946    ADMISSION DATE:  11/30/2017 CONSULTATION DATE:  11/30/2017  REFERRING MD:  [facility]  CHIEF COMPLAINT:  Respiratory distress  HISTORY OF PRESENT ILLNESS:   72 y/o female presented to [facility] on 10/29/17 with pneumonia, transferred to [facility], had a GI bleed, had bilateral cortical strokes, later transferred to [facility], intubated there.  Moved to [facility] for CRRT.  Has baseline COPD, Afib, HFpEF, ESRD, CVA, dysphagia.   SUBJECTIVE:  Remains on vasopressors for blood pressure support Has been off of vasopressors   VITAL SIGNS: BP (!) 114/56   Pulse (!) 118   Temp 100.2 F (37.9 C) (Axillary)   Resp (!) 28   Ht 5\' 6"  (1.676 m) Comment: Per [facility] documents  Wt 149 lb 7.6 oz (67.8 kg)   SpO2 97%   BMI 24.13 kg/m   HEMODYNAMICS:    VENTILATOR SETTINGS: Vent Mode: PCV FiO2 (%):  [40 %] 40 % Set Rate:  [16 bmp] 16 bmp PEEP:  [5 cmH20] 5 cmH20 Pressure Support:  [5 cmH20] 5 cmH20 Plateau Pressure:  [14 cmH20-18 cmH20] 16 cmH20  INTAKE / OUTPUT: I/O last 3 completed shifts: In: 2575.8 [I.V.:915.8; Other:480; NG/GT:1080; IV Piggyback:100] Out: 350 [Stool:350]  PHYSICAL EXAMINATION:  General:  In bed on vent HENT: NCAT Trach in place PULM: Few crackles bases, vent supported breathing CV: RRR, no mgr GI: BS+, soft, nontender, PEG in place MSK: normal bulk and tone Neuro awake on vent, can raise right hand to command, nods head appropriately    LABS:  BMET Recent Labs  Lab 12/08/17 1509 12/09/17 0419 12/10/17 0315  NA 142 140 141  K 3.7 3.6 3.9  CL 104 102 104  CO2 29 27 26   BUN 35* 58* 105*  CREATININE 1.19* 1.81* 2.80*  GLUCOSE 192* 240* 263*    Electrolytes Recent Labs  Lab 12/08/17 0334  12/08/17 1509 12/09/17 0419 12/10/17 0315  CALCIUM  --    < > 9.4 9.3 9.7  MG 2.5*  --   --  2.5* 2.5*  PHOS  --    < > 1.9* 2.6 3.4   < > = values in  this interval not displayed.    CBC Recent Labs  Lab 12/08/17 0334 12/09/17 0419 12/10/17 0315  WBC 22.5* 22.2* 25.3*  HGB 8.6* 7.6* 7.6*  HCT 30.0* 26.5* 26.0*  PLT 174 182 189    Coag's Recent Labs  Lab 12/08/17 0334 12/09/17 0419 12/10/17 0315  INR 1.14 1.21 1.17    Sepsis Markers No results for input(s): LATICACIDVEN, PROCALCITON, O2SATVEN in the last 168 hours.  ABG Recent Labs  Lab 12/05/17 0510 12/06/17 0516 12/08/17 0605  PHART 7.337* 7.342* 7.381  PCO2ART 48.5* 48.6* 44.1  PO2ART 104 70.0* 56.0*    Liver Enzymes Recent Labs  Lab 12/06/17 0426  12/08/17 1509 12/09/17 0419 12/10/17 0315  AST 29  --   --   --   --   ALT 16  --   --   --   --   ALKPHOS 94  --   --   --   --   BILITOT 0.6  --   --   --   --   ALBUMIN 2.9*   < > 2.5* 2.6* 2.6*   < > = values in this interval not displayed.    Cardiac Enzymes No results for input(s): TROPONINI, PROBNP in the last 168 hours.  Glucose Recent Labs  Lab 12/09/17 1204 12/09/17 1557 12/09/17 2020 12/09/17 2326 12/10/17 0338 12/10/17 0726  GLUCAP 241* 245* 216* 231* 248* 269*    Imaging Dg Chest Port 1 View  Result Date: 12/10/2017 CLINICAL DATA:  Respiratory failure, shortness of breath. History of COPD EXAM: PORTABLE CHEST 1 VIEW COMPARISON:  Chest x-ray of December 08, 2017 FINDINGS: The lungs are mildly hyperinflated. The interstitial markings are coarse though stable. A tracheostomy tube is present whose tip projects between the clavicular heads. The heart is top-normal in size. The pulmonary vascularity is not engorged. A dual-lumen dialysis type catheter is present via the right internal jugular produce with the tip of the distal port overlying the distal third of the SVC. A left internal jugular venous catheter has its tip projecting over the midportion of the SVC. IMPRESSION: Chronic bronchitic changes. Minimal bibasilar atelectasis. No overt pulmonary edema. The support tubes are in reasonable  position. Electronically Signed   By: David  Swazil[place]Jordan M.D.   On: 12/10/2017 07:07     STUDIES:  Echo 6/08 >> EF 65 to 70%, grade 1 DD  CULTURES: Blood 6/7 >> negative Sputum 6/7 >> Proteus Blood 6/16 >> Sputum 6/16 >>   ANTIBIOTICS: 6/08 Zosyn>> 6/10 6/08 vancomycin>> 6/10 6/11 Rocephin >> 6/14 Rocephin 6/16 >>   SIGNIFICANT EVENTS: 6/07 [facility]  6/08 started on dobutamine drip, cardiology consulted 6/15 transition off CRRT 6/16 recurrent fever  LINES/TUBES: CVC Left IJ 6/7 (OSH) >>  ETT 6/6 (OSH) >> 6/12 Right IJ Tunneled HD >>  Trach 6/12 >>    DISCUSSION: 72 y/o female with multiple medical problems here with septic shock, chronic respiratory failure requiring mechanical ventilation.  She has ESRD and her blood pressure has been low requiring CRRT.  Prognosis guarded.  ASSESSMENT / PLAN:  PULMONARY A: Acute respiratory failure with hypoxemia COPD Tracheostomy status P:   Continue pressure support during daytime as able Trach care per routine VAP prevention bundle CXR prn  CARDIOVASCULAR A:  Refractory shock, presumed sepsis but refractory nature of the shock argues that her metabolic state (renal failure, etc) is also complicating  Hypotension Afib  P:  Continue levophed, midodrine and hydrocortisone to maintain MAP > 65 Tele Amiodarone Heparin infusion for Afib   RENAL A:   ESRD P:   With persistent hypotension, we are unable to transition to intermittent hemodialysis Holding HD today per renal  Doubt that continued CRRT is going to be medically beneficial  GASTROINTESTINAL A:   Dysphagia P:   Continue tube feedings and stress ulcer prophylaxis  HEMATOLOGIC A:   Anemia of chronic disease P:  Monitor for bleeding Transfuse for Hgb < 7gm/dL  INFECTIOUS A:   Health care associated pneumonia > proteus DVT prophylaxis P:   Continue ceftriaxone, plan 14 days Monitor blood cultures drawn on 6/15  ENDOCRINE A:    DM2   P:   Transition to lantus and SSI  NEUROLOGIC A:   Acute encephalopathy P:   RASS goal: 0 PAD protocol: wean precedex off as able   FAMILY  - Updates: called son Jill AlexandersJustin on 6/17 AM, plan face to face goals of care conversation today because her overall prognosis is poor  - Inter-disciplinary family meet or Palliative Care meeting due by:  Day 7  My cc time 35 minutes  Heber CarolinaBrent McQuaid, MD [facility] Pager: [phone] Cell: [phone] After 3pm or if no response, call [phone]   12/10/2017, 8:51 AM

## 2017-12-10 NOTE — Consult Note (Signed)
WOC Nurse wound consult note Reason for Consult: consult requested for buttock and sacrum, on low air loss bed, no family present at bedside to discuss plan of care  Wound type: unstageable to sacrum Pressure Injury POA: Yes  Measurement: sacrum 8.5 x 7cm Wound bed: 80% slough and eschar, 20% red and moist Drainage (amount, consistency, odor) none  Periwound: MASD to generalized buttock with stage 2 to L buttock, 2X1X.1cm, pink and moist Dressing procedure/placement/frequency: Santyl q day to eschar, barrier cream to generalized MASD ---  Sheila JoNikki Wiley,  RN Please re-consult if further assistance is needed.  Thank-you,  Sheila Mcgeeawn Hoyle Barkdull MSN, RN, CWOCN, [place], CNS [phone]

## 2017-12-10 NOTE — Progress Notes (Signed)
Admit: 11/30/2017 LOS: 10  16F ESRD admit from LTAC for hypotension, VDRF/Hypervolemia/Pulm Edema, inability to UF with iHD  Subjective:  . Still off CRRT- no improvement overall in hemodynamics- remains on NE @ 12.    06/16 0701 - 06/17 0700 In: 1607.1 [I.V.:542.1; NG/GT:585] Out: 0   Filed Weights   12/08/17 0500 12/09/17 0500 12/10/17 0316  Weight: 69.3 kg (152 lb 12.5 oz) 71.3 kg (157 lb 3 oz) 67.8 kg (149 lb 7.6 oz)    Scheduled Meds: . atorvastatin  80 mg Per Tube q1800  . chlorhexidine gluconate (MEDLINE KIT)  15 mL Mouth Rinse BID  . Chlorhexidine Gluconate Cloth  6 each Topical Daily  . collagenase   Topical Daily  . darbepoetin (ARANESP) injection - NON-DIALYSIS  60 mcg Subcutaneous Q Sat-1800  . escitalopram  20 mg Per Tube Daily  . feeding supplement (PRO-STAT SUGAR FREE 64)  30 mL Per Tube 5 X Daily  . folic acid  1 mg Per Tube Daily  . hydrocortisone sod succinate (SOLU-CORTEF) inj  50 mg Intravenous Q6H  . insulin aspart  2-6 Units Subcutaneous Q4H  . mouth rinse  15 mL Mouth Rinse 10 times per day  . midodrine  10 mg Oral Q8H  . pantoprazole sodium  40 mg Per Tube Daily  . QUEtiapine  50 mg Per Tube BID  . thiamine  100 mg Per Tube Daily  . traZODone  100 mg Per Tube QHS  . Warfarin - Pharmacist Dosing Inpatient   Does not apply q1800   Continuous Infusions: . sodium chloride 10 mL/hr at 12/09/17 0600  . cefTRIAXone (ROCEPHIN)  IV Stopped (12/10/17 0228)  . dexmedetomidine (PRECEDEX) IV infusion 0.4 mcg/kg/hr (12/10/17 0136)  . feeding supplement (VITAL AF 1.2 CAL) 1,000 mL (12/10/17 0320)  . heparin 1,000 Units/hr (12/10/17 0436)  . heparin 999 mL/hr at 11/30/17 2354  . insulin (NOVOLIN-R) infusion    . norepinephrine (LEVOPHED) Adult infusion 12 mcg/min (12/10/17 0612)  . dialysis replacement fluid (prismasate) 500 mL/hr at 12/08/17 0126  . dialysis replacement fluid (prismasate) 200 mL/hr at 12/07/17 1516  . dialysate (PRISMASATE) 1,500 mL/hr at  12/08/17 0410   PRN Meds:.sodium chloride, acetaminophen (TYLENOL) oral liquid 160 mg/5 mL, docusate, fentaNYL (SUBLIMAZE) injection, heparin, heparin, ipratropium-albuterol, magnesium hydroxide, metoprolol tartrate, midazolam  Current Labs: reviewed    Physical Exam:  Blood pressure (!) 114/56, pulse (!) 118, temperature 100.2 F (37.9 C), temperature source Axillary, resp. rate (!) 28, height 5' 6"  (1.676 m), weight 67.8 kg (149 lb 7.6 oz), SpO2 97 %. GEN: Chronically Ill Appearing, trach on vent, cachectic ACCESS: R IJ Tunneled HD cath, no erythema or drainage CV: Nl S1s2, no rub IRIR PULM: Coarse BS b/l EXT: no LE edema  A/P 1. ESRD, recent start of HD prior to admission at [facility], has R IJ tunneled HD cath.  Unfortunately she has not been able to UF with IHD and she remains on pressors.  There is no immediate need for dialysis today and I don't think putting her back on CRRT will improve her overall prognosis.  Therefore, will hold any type of dialytic intervention today.  Strongly recommend getting in contact with family/ GOC discussion. 2. Hypervolemia/Pulmonoary Edema/VDRF/COPD; trach 6/12; weight down 40lb from admission 3. Anemia of CKD; recent IV Fe, on aranesp 13mg qSat; monitoring 4. Hypotension, ? Septic shock: still on pressors, on appropriate antibiotics, follow cultures 5. AFib on anticoagulation, systemic heparin, warfarin resumed 6. Hx/o CVA with L sided hemiplegia, PEG  tube  7. DM2 8. CKDBMD 9. Hypophosphatemia intermittently repleting while on CRRT 10. Dispo: prognosis is poor if no other explanation for chronic hypotension and we can't wean pressor    Madelon Lips MD 12/10/2017, 8:54 AM  Recent Labs  Lab 12/08/17 1509 12/09/17 0419 12/10/17 0315  NA 142 140 141  K 3.7 3.6 3.9  CL 104 102 104  CO2 29 27 26   GLUCOSE 192* 240* 263*  BUN 35* 58* 105*  CREATININE 1.19* 1.81* 2.80*  CALCIUM 9.4 9.3 9.7  PHOS 1.9* 2.6 3.4   Recent Labs  Lab  12/08/17 0334 12/09/17 0419 12/10/17 0315  WBC 22.5* 22.2* 25.3*  HGB 8.6* 7.6* 7.6*  HCT 30.0* 26.5* 26.0*  MCV 96.2 98.9 95.9  PLT 174 182 189

## 2017-12-10 NOTE — Progress Notes (Signed)
ANTICOAGULATION CONSULT NOTE  Pharmacy Consult for heparin Indication: atrial fibrillation  Patient Measurements: Height: 5\' 6"  (167.6 cm)(Per [facility] documents) Weight: 149 lb 7.6 oz (67.8 kg) IBW/kg (Calculated) : 59.3 Heparin Dosing Weight: 75.8 kg  Vital Signs: Temp: 99.7 F (37.6 C) (06/17 1110) Temp Source: Axillary (06/17 1110) BP: 116/56 (06/17 1215) Pulse Rate: 100 (06/17 1215)  Labs: Recent Labs    12/08/17 0334  12/08/17 1509 12/09/17 0419 12/09/17 1419 12/10/17 0315  HGB 8.6*  --   --  7.6*  --  7.6*  HCT 30.0*  --   --  26.5*  --  26.0*  PLT 174  --   --  182  --  189  LABPROT 14.5  --   --  15.2  --  14.8  INR 1.14  --   --  1.21  --  1.17  HEPARINUNFRC 0.13*   < >  --  0.76* 0.31 0.27*  CREATININE  --    < > 1.19* 1.81*  --  2.80*   < > = values in this interval not displayed.   Assessment: 72 yo female previously on Eliquis and was recently transitioned to warfarin - as documented in shadow chart. INR remained therapeutic for several days without receiving any anticoagulation. Heparin was initiated when INR fell < 2. IV heparin at 1000 units/hr. INR this AM is 1.17 after 2 doses of warfarin 6 mg  Goal of Therapy:  Heparin level 0.3-0.7 units/ml Monitor platelets by anticoagulation protocol: Yes  INR 2-3    Plan:  Continue IV heparin gtt 1000 units/hr Warfarin 7.5 mg x 1 dose today  Daily heparin level and CBC  Thanks for allowing pharmacy to be a part of this patient's care.  Vinnie LevelBenjamin Brylei Pedley, PharmD., BCPS Clinical Pharmacist Clinical phone for 12/10/17 until 3:30pm: [phone] If after 3:30pm, please call main pharmacy at: [phone]

## 2017-12-10 NOTE — Progress Notes (Signed)
ANTICOAGULATION CONSULT NOTE  Pharmacy Consult for heparin Indication: atrial fibrillation  Patient Measurements: Height: 5\' 6"  (167.6 cm)(Per [facility]) Weight: 149 lb 7.6 oz (67.8 kg) IBW/kg (Calculated) : 59.3 Heparin Dosing Weight: 75.8 kg  Vital Signs: Temp: 99.2 F (37.3 C) (06/17 0300) Temp Source: Axillary (06/17 0300) BP: 135/61 (06/17 0300) Pulse Rate: 93 (06/17 0300)  Labs: Recent Labs    12/08/17 0334 12/08/17 0500  12/08/17 1509 12/09/17 0419 12/09/17 1419 12/10/17 0315  HGB 8.6*  --   --   --  7.6*  --  7.6*  HCT 30.0*  --   --   --  26.5*  --  26.0*  PLT 174  --   --   --  182  --  189  LABPROT 14.5  --   --   --  15.2  --  14.8  INR 1.14  --   --   --  1.21  --  1.17  HEPARINUNFRC 0.13*  --    < >  --  0.76* 0.31 0.27*  CREATININE  --  0.75  --  1.19* 1.81*  --   --    < > = values in this interval not displayed.   Assessment: 72 yo female previously on Eliquis and was recently transitioned to warfarin - as documented in shadow chart. INR remained therapeutic for several days without receiving any anticoagulation. Heparin was initiated when INR fell < 2. Heparin level this am slightly subtherapeutic at 0.27 units/ml  Goal of Therapy:  Heparin level 0.3-0.7 units/ml Monitor platelets by anticoagulation protocol: Yes   Plan:  Increase heparin gtt 1000 units/hr Daily heparin level and CBC  Thanks for allowing pharmacy to be a part of this patient's care.  Talbert CageLora Jnyah Brazee, PharmD

## 2017-12-10 NOTE — Progress Notes (Signed)
Pt is sleeping comfortably at this time, no distress or complications noted

## 2017-12-11 ENCOUNTER — Inpatient Hospital Stay (HOSPITAL_COMMUNITY): Payer: Medicare Other

## 2017-12-11 DIAGNOSIS — A419 Sepsis, unspecified organism: Secondary | ICD-10-CM

## 2017-12-11 DIAGNOSIS — R6521 Severe sepsis with septic shock: Secondary | ICD-10-CM

## 2017-12-11 LAB — GLUCOSE, CAPILLARY
GLUCOSE-CAPILLARY: 171 mg/dL — AB (ref 65–99)
GLUCOSE-CAPILLARY: 199 mg/dL — AB (ref 65–99)
GLUCOSE-CAPILLARY: 202 mg/dL — AB (ref 65–99)
Glucose-Capillary: 223 mg/dL — ABNORMAL HIGH (ref 65–99)
Glucose-Capillary: 239 mg/dL — ABNORMAL HIGH (ref 65–99)
Glucose-Capillary: 226 mg/dL — ABNORMAL HIGH (ref 65–99)

## 2017-12-11 LAB — RENAL FUNCTION PANEL
Albumin: 2.6 g/dL — ABNORMAL LOW (ref 3.5–5.0)
Anion gap: 16 — ABNORMAL HIGH (ref 5–15)
BUN: 150 mg/dL — AB (ref 6–20)
CALCIUM: 9.8 mg/dL (ref 8.9–10.3)
CHLORIDE: 101 mmol/L (ref 101–111)
CO2: 23 mmol/L (ref 22–32)
CREATININE: 3.5 mg/dL — AB (ref 0.44–1.00)
GFR calc non Af Amer: 12 mL/min — ABNORMAL LOW (ref >60)
GFR, EST AFRICAN AMERICAN: 14 mL/min — AB (ref >60)
Glucose, Bld: 223 mg/dL — ABNORMAL HIGH (ref 65–99)
Phosphorus: 4.4 mg/dL (ref 2.5–4.6)
Potassium: 3.7 mmol/L (ref 3.5–5.1)
SODIUM: 140 mmol/L (ref 135–145)

## 2017-12-11 LAB — PROTIME-INR
INR: 1.31
Prothrombin Time: 16.2 seconds — ABNORMAL HIGH (ref 11.4–15.2)

## 2017-12-11 LAB — CBC
HEMATOCRIT: 25.3 % — AB (ref 36.0–46.0)
HEMOGLOBIN: 7.5 g/dL — AB (ref 12.0–15.0)
MCH: 28 pg (ref 26.0–34.0)
MCHC: 29.6 g/dL — ABNORMAL LOW (ref 30.0–36.0)
MCV: 94.4 fL (ref 78.0–100.0)
Platelets: 205 10*3/uL (ref 150–400)
RBC: 2.68 MIL/uL — AB (ref 3.87–5.11)
RDW: 19.2 % — ABNORMAL HIGH (ref 11.5–15.5)
WBC: 25.1 10*3/uL — ABNORMAL HIGH (ref 4.0–10.5)

## 2017-12-11 LAB — CULTURE, RESPIRATORY W GRAM STAIN

## 2017-12-11 LAB — HEPARIN LEVEL (UNFRACTIONATED)
Heparin Unfractionated: 0.19 IU/mL — ABNORMAL LOW (ref 0.30–0.70)
Heparin Unfractionated: 1.1 IU/mL — ABNORMAL HIGH (ref 0.30–0.70)

## 2017-12-11 LAB — CULTURE, RESPIRATORY

## 2017-12-11 LAB — MAGNESIUM: Magnesium: 2.6 mg/dL — ABNORMAL HIGH (ref 1.7–2.4)

## 2017-12-11 MED ORDER — SODIUM CHLORIDE 0.9 % IV SOLN: 100.0000 mL | INTRAVENOUS | Status: DC | PRN

## 2017-12-11 MED ORDER — INSULIN GLARGINE 100 UNIT/ML ~~LOC~~ SOLN
35.0000 [IU] | Freq: Every day | SUBCUTANEOUS | Status: DC
  Administered 2017-12-12 – 2017-12-13 (×2): 35 [IU] via SUBCUTANEOUS
  Filled 2017-12-11 (×2): qty 0.35

## 2017-12-11 MED ORDER — ALTEPLASE 2 MG IJ SOLR: 2.0000 mg | Freq: Once | INTRAMUSCULAR | Status: DC | PRN

## 2017-12-11 MED ORDER — SODIUM CHLORIDE 0.9 % IV SOLN
1.0000 g | INTRAVENOUS | Status: DC
  Administered 2017-12-11 – 2017-12-15 (×5): 1 g via INTRAVENOUS
  Filled 2017-12-11 (×5): qty 1

## 2017-12-11 MED ORDER — PENTAFLUOROPROP-TETRAFLUOROETH EX AERO: 1.0000 "application " | INHALATION_SPRAY | CUTANEOUS | Status: DC | PRN

## 2017-12-11 MED ORDER — HEPARIN (PORCINE) IN NACL 100-0.45 UNIT/ML-% IJ SOLN
950.0000 [IU]/h | INTRAMUSCULAR | Status: DC
  Administered 2017-12-12 – 2017-12-14 (×3): 950 [IU]/h via INTRAVENOUS
  Filled 2017-12-11 (×3): qty 250

## 2017-12-11 MED ORDER — CHLORHEXIDINE GLUCONATE CLOTH 2 % EX PADS
6.0000 | MEDICATED_PAD | Freq: Every day | CUTANEOUS | Status: DC
  Administered 2017-12-11: 6 via TOPICAL

## 2017-12-11 MED ORDER — NOREPINEPHRINE 4 MG/250ML-% IV SOLN: 0.0000 ug/min | INTRAVENOUS | Status: DC

## 2017-12-11 MED ORDER — LIDOCAINE-PRILOCAINE 2.5-2.5 % EX CREA: 1.0000 "application " | TOPICAL_CREAM | CUTANEOUS | Status: DC | PRN

## 2017-12-11 MED ORDER — HEPARIN SODIUM (PORCINE) 1000 UNIT/ML DIALYSIS: 1000.0000 [IU] | INTRAMUSCULAR | Status: DC | PRN

## 2017-12-11 MED ORDER — LIDOCAINE HCL (PF) 1 % IJ SOLN: 5.0000 mL | INTRAMUSCULAR | Status: DC | PRN

## 2017-12-11 MED ORDER — ARFORMOTEROL TARTRATE 15 MCG/2ML IN NEBU
15.0000 ug | INHALATION_SOLUTION | Freq: Two times a day (BID) | RESPIRATORY_TRACT | Status: DC
  Administered 2017-12-11 – 2017-12-19 (×17): 15 ug via RESPIRATORY_TRACT
  Filled 2017-12-11 (×17): qty 2

## 2017-12-11 MED ORDER — ORAL CARE MOUTH RINSE
15.0000 mL | OROMUCOSAL | Status: DC
  Administered 2017-12-11 – 2017-12-14 (×16): 15 mL via OROMUCOSAL

## 2017-12-11 MED ORDER — CHLORHEXIDINE GLUCONATE CLOTH 2 % EX PADS: 6.0000 | MEDICATED_PAD | Freq: Every day | CUTANEOUS | Status: DC

## 2017-12-11 MED ORDER — BUDESONIDE 0.5 MG/2ML IN SUSP
0.5000 mg | Freq: Two times a day (BID) | RESPIRATORY_TRACT | Status: DC
  Administered 2017-12-11 – 2017-12-19 (×17): 0.5 mg via RESPIRATORY_TRACT
  Filled 2017-12-11 (×17): qty 2

## 2017-12-11 MED ORDER — SODIUM CHLORIDE 0.9 % IV SOLN
100.0000 mL | INTRAVENOUS | Status: DC | PRN
  Administered 2017-12-11: 12:00:00 via INTRAVENOUS

## 2017-12-11 MED ORDER — WARFARIN SODIUM 7.5 MG PO TABS
7.5000 mg | ORAL_TABLET | Freq: Once | ORAL | Status: AC
  Administered 2017-12-11: 7.5 mg via ORAL
  Filled 2017-12-11: qty 1

## 2017-12-11 NOTE — Care Management Note (Signed)
Case Management Note  Patient Details  Name: Sheila Cabrera MRN: [id number] Date of Birth: Feb 21, 1946  Subjective/Objective:  Sheila Cabrera is a 72 y.o. female admitted on 11/30/2017 with pneumonia.  Janina Mayo. Trach aspirate growing pseudomonas. Patient has been transition to HD.                    Action/Plan:  [facility]. CM spoke with Irving BurtonEmily, [facility] Liaison concerning bed availability, no HD bed is available today, she will keep CM informed. Patient's condition remains the same. Family is not ready to make her a DNR.       Expected Discharge Date:    pending.               Expected Discharge Plan:  Long Term Acute Care (LTAC)(from [facility])  In-House Referral:     Discharge planning Services  CM Consult  Post Acute Care Choice:  Long Term Acute Care (LTAC) Choice offered to:     DME Arranged:  N/A DME Agency:  NA  HH Arranged:  NA HH Agency:  NA  Status of Service:  In process, will continue to follow  If discussed at Long Length of Stay Meetings, dates discussed:    Additional Comments:  Durenda GuthrieBrady, Chayil Gantt Naomi, RN 12/11/2017, 11:46 AM

## 2017-12-11 NOTE — Progress Notes (Signed)
ANTICOAGULATION CONSULT NOTE - Follow Up Consult  Pharmacy Consult for heparin Indication: atrial fibrillation  Labs: Recent Labs    12/09/17 0419 12/09/17 1419 12/10/17 0315 12/11/17 0235  HGB 7.6*  --  7.6* 7.5*  HCT 26.5*  --  26.0* 25.3*  PLT 182  --  189 205  LABPROT 15.2  --  14.8 16.2*  INR 1.21  --  1.17 1.31  HEPARINUNFRC 0.76* 0.31 0.27* 0.19*  CREATININE 1.81*  --  2.80* 3.50*    Assessment: 72yo female subtherapeutic on heparin with lower heparin level despite rate increase yesterday; RN reports that heparin gtt has to be paused to draw labs from PICC which may contribute to inaccuracy; Hgb low but stable, no signs of bleeding per RN.  Goal of Therapy:  Heparin level 0.3-0.7 units/ml   Plan:  Will increase heparin gtt by 1-2 units/kg/hr to 1100 units/hr and check level in 8 hours.    Vernard GamblesVeronda Jaron Czarnecki, PharmD, BCPS  12/11/2017,5:01 AM

## 2017-12-11 NOTE — Progress Notes (Signed)
ANTICOAGULATION CONSULT NOTE  Pharmacy Consult for heparin Indication: atrial fibrillation  Patient Measurements: Height: 5\' 6"  (167.6 cm)([facility]) Weight: 154 lb 12.2 oz (70.2 kg) IBW/kg (Calculated) : 59.3 Heparin Dosing Weight: 75.8 kg  Vital Signs: Temp: 99.3 F (37.4 C) (06/18 1127) Temp Source: Oral (06/18 1127) BP: 97/52 (06/18 1330) Pulse Rate: 82 (06/18 1330)  Labs: Recent Labs    12/09/17 0419  12/10/17 0315 12/11/17 0235 12/11/17 1345  HGB 7.6*  --  7.6* 7.5*  --   HCT 26.5*  --  26.0* 25.3*  --   PLT 182  --  189 205  --   LABPROT 15.2  --  14.8 16.2*  --   INR 1.21  --  1.17 1.31  --   HEPARINUNFRC 0.76*   < > 0.27* 0.19* 1.10*  CREATININE 1.81*  --  2.80* 3.50*  --    < > = values in this interval not displayed.   Assessment: 72 yo female previously on Eliquis and was recently transitioned to warfarin - as documented in shadow chart. INR remained therapeutic for several days without receiving any anticoagulation. Heparin was initiated when INR fell < 2.  IV heparin at 1100 units/hr. HL this afternoon was elevated at 1.1. Per RN, IV heparin is running through central line and heparin level was drawn from HD cath. INR this AM has trended up to 1.31 after 3 doses of warfarin. H/H low stable. Plt improved   Goal of Therapy:  Heparin level 0.3-0.7 units/ml Monitor platelets by anticoagulation protocol: Yes  INR 2-3    Plan:  Hold IV heparin x 1 hour. Resume IV heparin at 950 units/hr with no bolus  Warfarin 7.5 mg x 1 dose today  F/u HL 8 hours after resuming IV heparin Daily heparin level and CBC  Thanks for allowing pharmacy to be a part of this patient's care.  Vinnie LevelBenjamin Manasvi Dickard, PharmD., BCPS Clinical Pharmacist Clinical phone for 12/11/17 until 3:30pm: [phone] If after 3:30pm, please call main pharmacy at: [phone]

## 2017-12-11 NOTE — Progress Notes (Signed)
Pharmacy Antibiotic Note  Sheila Cabrera is a 72 y.o. female admitted on 11/30/2017 with pneumonia.  Pharmacy has been consulted for cefepime dosing. Trach aspirate growing pseudomonas. WBC remains elevated at 25.1. Patient is transitioning from CRRT to HD today.   Plan: -Cefepime 1gm IV Q 24 hours while on HD -Monitor cultures and clinical progress  Height: 5\' 6"  (167.6 cm)([facility]) Weight: 154 lb 12.2 oz (70.2 kg) IBW/kg (Calculated) : 59.3  Temp (24hrs), Avg:99.6 F (37.6 C), Min:98.8 F (37.1 C), Max:100.6 F (38.1 C)  Recent Labs  Lab 12/07/17 0434 12/08/17 0334 12/08/17 0500 12/08/17 1509 12/09/17 0419 12/10/17 0315 12/11/17 0235  WBC 20.6* 22.5*  --   --  22.2* 25.3* 25.1*  CREATININE 0.82  --  0.75 1.19* 1.81* 2.80* 3.50*    Estimated Creatinine Clearance: 14 mL/min (A) (by C-G formula based on SCr of 3.5 mg/dL (H)).    No Known Allergies   Cefepime 6/18>>  Ceftriaxone 6/11 > 6/15;6/16 > 1/8  Vanc 6/8 >> 6/11  Zosyn 6/8 >> 6/11  6/7 BCx>> NEG 6/7 MRSA PCR>>NEG 6/7 resp cx: proteus 6/16 TA > pseudomonas   Thank you for allowing pharmacy to be a part of this patient's care.  Vinnie LevelBenjamin Deshone Lyssy, PharmD., BCPS Clinical Pharmacist Clinical phone for 12/11/17 until 3:30pm: [phone] If after 3:30pm, please call main pharmacy at: [phone]

## 2017-12-11 NOTE — Progress Notes (Signed)
[facility] Progress Note   Assessment/ Plan:   1. ESRD, recent start of HD prior to admission at [facility], has R IJ tunneled HD cath.  Unfortunately she has not been able to UF with IHD and she remains on pressors.  There is no immediate need for dialysis today and I don't think putting her back on CRRT will improve her overall prognosis.  If we can wean pressors further today that will help [place] be more successful with IHD attempts- will write for IHD tomorrow.  If can't tolerate IHD will need to approach family again 2. Hypervolemia/Pulmonoary Edema/VDRF/COPD; trach 6/12; weight down 40lb from admission 3. Anemia of CKD; recent IV Fe, on aranesp 50mg qSat; monitoring 4. Hypotension, ? Septic shock: still on pressors, on appropriate antibiotics, resp culture with rare psuedomonas, blood cultures negative 5. AFib on anticoagulation, systemic heparin, warfarin resumed 6. Hx/o CVA with L sided hemiplegia, PEG tube  7. DM2 8. CKDBMD 9. Dispo: prognosis is poor if no other explanation for chronic hypotension and we can't wean pressor   Subjective:    FM held yesterday- full code, full scope of care per nursing staff.  On levophed at 5 this AM.  Crying this AM "I'm so lonely".     Objective:   BP (!) 129/56   Pulse 80   Temp 98.8 F (37.1 C) (Oral)   Resp 19   Ht _0  (1.676 m) Comment: Per [facility] documents  Wt 70.2 kg (154 lb 12.2 oz)   SpO2 99%   BMI 24.98 kg/m   Physical Exam: GEN: Chronically Ill Appearing, trach on vent, cachectic, tearful ACCESS: R IJ Tunneled HD cath, no erythema or drainage CV: irregular, no rub PULM: Coarse BS b/l EXT: 1+ upper extremity edema   Labs: BMET Recent Labs  Lab 12/06/17 1522 12/07/17 0434 12/08/17 0500 12/08/17 1509 12/09/17 0419 12/10/17 0315 12/11/17 0235  NA 139 135 138 142 140 141 140  K 3.9 3.4* 3.6 3.7 3.6 3.9 3.7  CL 102 105 102 104 102 104 101  CO2 _1 GLUCOSE 165* 235* 154* 192*  240* 263* 223*  BUN 29* 26* 24* 35* 58* 105* 150*  CREATININE 0.70 0.82 0.75 1.19* 1.81* 2.80* 3.50*  CALCIUM 9.1 8.1* 9.1 9.4 9.3 9.7 9.8  PHOS 1.6* 1.5* 2.0* 1.9* 2.6 3.4 4.4   CBC Recent Labs  Lab 12/08/17 0334 12/09/17 0419 12/10/17 0315 12/11/17 0235  WBC 22.5* 22.2* 25.3* 25.1*  HGB 8.6* 7.6* 7.6* 7.5*  HCT 30.0* 26.5* 26.0* 25.3*  MCV 96.2 98.9 95.9 94.4  PLT 174 182 189 205    _2 @ Medications:    . arformoterol  15 mcg Nebulization BID  . atorvastatin  80 mg Per Tube q1800  . budesonide (PULMICORT) nebulizer solution  0.5 mg Nebulization BID  . chlorhexidine gluconate (MEDLINE KIT)  15 mL Mouth Rinse BID  . Chlorhexidine Gluconate Cloth  6 each Topical Daily  . collagenase   Topical Daily  . darbepoetin (ARANESP) injection - NON-DIALYSIS  60 mcg Subcutaneous Q Sat-1800  . escitalopram  20 mg Per Tube Daily  . feeding supplement (PRO-STAT SUGAR FREE 64)  30 mL Per Tube 5 X Daily  . folic acid  1 mg Per Tube Daily  . hydrocortisone sod succinate (SOLU-CORTEF) inj  50 mg Intravenous Q6H  . insulin aspart  0-15 Units Subcutaneous Q4H  . insulin glargine  20 Units Subcutaneous Daily  . mouth rinse  15 mL Mouth Rinse Q4H  . midodrine  10 mg Oral Q8H  . pantoprazole sodium  40 mg Per Tube Daily  . QUEtiapine  50 mg Per Tube BID  . thiamine  100 mg Per Tube Daily  . traZODone  100 mg Per Tube QHS  . Warfarin - Pharmacist Dosing Inpatient   Does not apply q1800    Madelon Lips, MD 12/11/2017, 9:46 AM

## 2017-12-11 NOTE — Progress Notes (Signed)
PULMONARY / CRITICAL CARE MEDICINE   Name: Sheila Cabrera MRN: [id number] DOB: Nov 07, 1945    ADMISSION DATE:  11/30/2017 CONSULTATION DATE:  11/30/2017  REFERRING MD:  [facility]  CHIEF COMPLAINT:  Respiratory distress  HISTORY OF PRESENT ILLNESS:   72 y/[facility], [facility], had a GI bleed, had bilateral cortical strokes, later transferred to [facility], intubated there.  [facility].  Has baseline COPD, Afib, HFpEF, ESRD, CVA, dysphagia.   SUBJECTIVE:  No acute events On lower doses of vasopressors  VITAL SIGNS: BP (!) 125/59 (BP Location: Left Arm)   Pulse 81   Temp 98.8 F (37.1 C) (Oral)   Resp 20   Ht 5\' 6"  (1.676 m) Comment: Per [facility] documents  Wt 154 lb 12.2 oz (70.2 kg)   SpO2 99%   BMI 24.98 kg/m   HEMODYNAMICS:    VENTILATOR SETTINGS: Vent Mode: PCV FiO2 (%):  [40 %] 40 % Set Rate:  [16 bmp] 16 bmp PEEP:  [5 cmH20] 5 cmH20 Plateau Pressure:  [16 cmH20-19 cmH20] 16 cmH20  INTAKE / OUTPUT: I/O last 3 completed shifts: In: 2580.8 [I.V.:1130.8; NG/GT:1350; IV Piggyback:100] Out: 200 [Stool:200]  PHYSICAL EXAMINATION:  General:  In bed on vent HENT: NCAT trach in place PULM: CTA B, vent supported breathing CV: RRR, no mgr GI: BS+, soft, nontender MSK: normal bulk and tone Neuro: awake on vent, moves left hand well, attempts to talk as best as possible considering trach   LABS:  BMET Recent Labs  Lab 12/09/17 0419 12/10/17 0315 12/11/17 0235  NA 140 141 140  K 3.6 3.9 3.7  CL 102 104 101  CO2 27 26 23   BUN 58* 105* 150*  CREATININE 1.81* 2.80* 3.50*  GLUCOSE 240* 263* 223*    Electrolytes Recent Labs  Lab 12/09/17 0419 12/10/17 0315 12/11/17 0235  CALCIUM 9.3 9.7 9.8  MG 2.5* 2.5* 2.6*  PHOS 2.6 3.4 4.4    CBC Recent Labs  Lab 12/09/17 0419 12/10/17 0315 12/11/17 0235  WBC 22.2* 25.3* 25.1*  HGB 7.6* 7.6* 7.5*  HCT 26.5* 26.0* 25.3*  PLT  182 189 205    Coag's Recent Labs  Lab 12/09/17 0419 12/10/17 0315 12/11/17 0235  INR 1.21 1.17 1.31    Sepsis Markers No results for input(s): LATICACIDVEN, PROCALCITON, O2SATVEN in the last 168 hours.  ABG Recent Labs  Lab 12/05/17 0510 12/06/17 0516 12/08/17 0605  PHART 7.337* 7.342* 7.381  PCO2ART 48.5* 48.6* 44.1  PO2ART 104 70.0* 56.0*    Liver Enzymes Recent Labs  Lab 12/06/17 0426  12/09/17 0419 12/10/17 0315 12/11/17 0235  AST 29  --   --   --   --   ALT 16  --   --   --   --   ALKPHOS 94  --   --   --   --   BILITOT 0.6  --   --   --   --   ALBUMIN 2.9*   < > 2.6* 2.6* 2.6*   < > = values in this interval not displayed.    Cardiac Enzymes No results for input(s): TROPONINI, PROBNP in the last 168 hours.  Glucose Recent Labs  Lab 12/10/17 1107 12/10/17 1635 12/10/17 1923 12/10/17 2320 12/11/17 0310 12/11/17 0742  GLUCAP 230* 252* 253* 174* 223* 239*    Imaging No results found.   STUDIES:  Echo 6/08 >> EF 65 to 70%, grade 1 DD  CULTURES: Blood 6/7 >> negative Sputum 6/7 >> Proteus Blood 6/16 >> Sputum 6/16 >>   ANTIBIOTICS: 6/08 Zosyn>> 6/10 6/08 vancomycin>> 6/10 6/11 Rocephin >> 6/14 Rocephin 6/16 >>   SIGNIFICANT EVENTS: 6/07 Transported to [facility]  6/08 started on dobutamine drip, cardiology consulted 6/15 transition off CRRT 6/16 recurrent fever  LINES/TUBES: CVC Left IJ 6/7 (OSH) >>  ETT 6/6 (OSH) >> 6/12 Right IJ Tunneled HD >>  Trach 6/12 >>    DISCUSSION: 72 y/o female with multiple medical problems here with septic shock, chronic respiratory failure requiring mechanical ventilation.  She has ESRD and her blood pressure has been low requiring CRRT.  Prognosis guarded.  ASSESSMENT / PLAN:  PULMONARY A: Acute respiratory failure with hypoxemia COPD Tracheostomy status P:   Continue pressure support as able during daytime Full mechanical vent support VAP prevention Daily WUA/SBT Continue  brovana and pulmicort  CARDIOVASCULAR A:  Refractory shock, presumed sepsis but refractory nature of the shock argues that her metabolic state (renal failure, etc) is also complicating > improved 6/18 Hypotension Afib paroxysmal P:  Tele Change parameters for SBP > 90 to wean off levophed Continue midodrine and hydrocortisone Continue heparin infusion > OK to transition to warfarin   RENAL A:   ESRD P:   Plan HD on 6/19 Monitor BMET and UOP Replace electrolytes as needed  GASTROINTESTINAL A:   Dysphagia P:   Continue tube feedings and stress ulcer prophylaxis  HEMATOLOGIC A:   Anemia of chronic disease P:  Monitor for bleeding Transfuse for Hgb < 7gm/dL  INFECTIOUS A:   Health care associated pneumonia > proteus DVT prophylaxis P:   Continue ceftriaxone, plan 14 days  ENDOCRINE A:   DM2   P:   Increase Lantus Continue SSI  NEUROLOGIC A:   Acute encephalopathy P:   RASS goal 0 PAD protocol: wean off precedex as able    FAMILY  - Updates: Lengthy conversation with son Jill Alexanders yesterday, he says that he has been approached by several doctors at [facility], [facility] and now here to consider limiting her care because her prognosis is poor. He feels overwhelmed by the rapidity of her decline (just five weeks) and has trust issues with [facility] because his father died here several years ago.  I advised DNR, no escalation of care, he wanted to continue to think about it when I talked to him on 6/17.  Will consult palliative medicine to help with this conversation.  - Inter-disciplinary family meet or Palliative Care meeting due by:  Day 7  My cc time 31 minutes  Heber Lebanon, MD [place] [facility] Pager: [phone] Cell: [phone] After 3pm or if no response, call [phone]   12/11/2017, 9:10 AM

## 2017-12-11 NOTE — Progress Notes (Signed)
[facility]  Pseudomonas growing in sputum Change ceftriaxone to cefepime  Heber CarolinaBrent Briell Paulette, MD [place] [facility] Pager: [phone] Cell: [phone] After 3pm or if no response, call [phone]

## 2017-12-12 LAB — BLOOD GAS, ARTERIAL
ACID-BASE DEFICIT: 3.3 mmol/L — AB (ref 0.0–2.0)
Bicarbonate: 22.7 mmol/L (ref 20.0–28.0)
DRAWN BY: 205171
FIO2: 40
O2 Saturation: 89.6 %
PEEP: 5 cmH2O
PO2 ART: 65.5 mmHg — AB (ref 83.0–108.0)
PRESSURE CONTROL: 15 cmH2O
Patient temperature: 98.6
RATE: 16 resp/min
pCO2 arterial: 52 mmHg — ABNORMAL HIGH (ref 32.0–48.0)
pH, Arterial: 7.263 — ABNORMAL LOW (ref 7.350–7.450)

## 2017-12-12 LAB — PROTIME-INR
INR: 1.48
Prothrombin Time: 17.8 seconds — ABNORMAL HIGH (ref 11.4–15.2)

## 2017-12-12 LAB — CBC
HEMATOCRIT: 24.8 % — AB (ref 36.0–46.0)
HEMOGLOBIN: 7.3 g/dL — AB (ref 12.0–15.0)
MCH: 27.8 pg (ref 26.0–34.0)
MCHC: 29.4 g/dL — AB (ref 30.0–36.0)
MCV: 94.3 fL (ref 78.0–100.0)
Platelets: 237 10*3/uL (ref 150–400)
RBC: 2.63 MIL/uL — AB (ref 3.87–5.11)
RDW: 18.9 % — ABNORMAL HIGH (ref 11.5–15.5)
WBC: 21 10*3/uL — ABNORMAL HIGH (ref 4.0–10.5)

## 2017-12-12 LAB — GLUCOSE, CAPILLARY
GLUCOSE-CAPILLARY: 201 mg/dL — AB (ref 65–99)
GLUCOSE-CAPILLARY: 201 mg/dL — AB (ref 65–99)
GLUCOSE-CAPILLARY: 236 mg/dL — AB (ref 65–99)
GLUCOSE-CAPILLARY: 136 mg/dL — AB (ref 65–99)
Glucose-Capillary: 244 mg/dL — ABNORMAL HIGH (ref 65–99)
Glucose-Capillary: 141 mg/dL — ABNORMAL HIGH (ref 65–99)

## 2017-12-12 LAB — HEPARIN LEVEL (UNFRACTIONATED)
Heparin Unfractionated: 0.44 IU/mL (ref 0.30–0.70)
Heparin Unfractionated: 0.54 IU/mL (ref 0.30–0.70)

## 2017-12-12 LAB — BASIC METABOLIC PANEL
Anion gap: 14 (ref 5–15)
BUN: 187 mg/dL — ABNORMAL HIGH (ref 6–20)
CHLORIDE: 103 mmol/L (ref 101–111)
CO2: 23 mmol/L (ref 22–32)
CREATININE: 4.21 mg/dL — AB (ref 0.44–1.00)
Calcium: 9.6 mg/dL (ref 8.9–10.3)
GFR calc Af Amer: 11 mL/min — ABNORMAL LOW (ref >60)
GFR calc non Af Amer: 10 mL/min — ABNORMAL LOW (ref >60)
Glucose, Bld: 237 mg/dL — ABNORMAL HIGH (ref 65–99)
POTASSIUM: 3.8 mmol/L (ref 3.5–5.1)
Sodium: 140 mmol/L (ref 135–145)

## 2017-12-12 LAB — TROPONIN I: TROPONIN I: 0.1 ng/mL — AB (ref <0.03)

## 2017-12-12 MED ORDER — VITAL AF 1.2 CAL PO LIQD
1000.0000 mL | ORAL | Status: DC
  Administered 2017-12-13: 1000 mL

## 2017-12-12 MED ORDER — FENTANYL 2500MCG IN NS 250ML (10MCG/ML) PREMIX INFUSION
0.0000 ug/h | INTRAVENOUS | Status: DC
  Administered 2017-12-12 – 2017-12-14 (×5): 250 ug/h via INTRAVENOUS
  Administered 2017-12-14 – 2017-12-17 (×12): 400 ug/h via INTRAVENOUS
  Administered 2017-12-17: 200 ug/h via INTRAVENOUS
  Administered 2017-12-18: 400 ug/h via INTRAVENOUS
  Filled 2017-12-12 (×19): qty 250

## 2017-12-12 MED ORDER — WARFARIN SODIUM 7.5 MG PO TABS
7.5000 mg | ORAL_TABLET | Freq: Once | ORAL | Status: AC
  Administered 2017-12-12: 7.5 mg via ORAL
  Filled 2017-12-12: qty 1

## 2017-12-12 MED ORDER — NOREPINEPHRINE 16 MG/250ML-% IV SOLN
0.0000 ug/min | INTRAVENOUS | Status: DC
  Administered 2017-12-12: 5 ug/min via INTRAVENOUS
  Administered 2017-12-13: 10 ug/min via INTRAVENOUS
  Filled 2017-12-12: qty 250

## 2017-12-12 MED ORDER — ALTEPLASE 2 MG IJ SOLR: 2.0000 mg | Freq: Once | INTRAMUSCULAR | Status: DC

## 2017-12-12 MED ORDER — PRO-STAT SUGAR FREE PO LIQD
30.0000 mL | Freq: Two times a day (BID) | ORAL | Status: DC
  Administered 2017-12-13 – 2017-12-14 (×3): 30 mL
  Filled 2017-12-12 (×3): qty 30

## 2017-12-12 MED ORDER — ALTEPLASE 2 MG IJ SOLR
2.0000 mg | Freq: Once | INTRAMUSCULAR | Status: DC
  Filled 2017-12-12: qty 2

## 2017-12-12 MED ORDER — DARBEPOETIN ALFA 100 MCG/0.5ML IJ SOSY
100.0000 ug | PREFILLED_SYRINGE | INTRAMUSCULAR | Status: DC
  Administered 2017-12-12: 100 ug via INTRAVENOUS
  Filled 2017-12-12 (×2): qty 0.5

## 2017-12-12 NOTE — Progress Notes (Signed)
Nutrition Follow-up  DOCUMENTATION CODES:   Not applicable  INTERVENTION:   Tube Feeding:  Vital AF 1.2 @ 55 ml/hr Pro-Stat 30 mL BID Provides 1784 kcals, 129 g of protein and 1069 mL of free water Meets 100% of protein needs, 99% calorie needs  NUTRITION DIAGNOSIS:   Inadequate oral intake related to inability to eat as evidenced by NPO status.  Being addressed via TF   GOAL:   Patient will meet greater than or equal to 90% of their needs  Met  MONITOR:   Vent status, TF tolerance, Labs, I & O's, Skin  REASON FOR ASSESSMENT:   Ventilator, Consult(Verbal) Enteral/tube feeding initiation and management  ASSESSMENT:   72 yo female admitted with acute on chronic respiratory failure in setting of decompensated heart failure vs PNA, hx of COPD.Marland Kitchen Pt with hx of DM, COPD, CHF, ESRD on HD, CVA, dysphagia s/p PEG tube  6/15 CRRT discontinued  Pt remains on vent support via trach Pt with ESRD on HD as outpatient. Transtioned off CRRT pt not tolerating iHD at present, noted palliative care consulted with Ramireno meeting tomorrow  Vital AF 1.2 @ 45 ml/hr with Pro-Stat 30 mL 5 times daily via PEG tube  Labs: CBGs 200s, BUN 187, Creatinine 4.21 Meds: ss novolog, lantus, levophed   Diet Order:   Diet Order           Diet NPO time specified  Diet effective midnight          EDUCATION NEEDS:   No education needs have been identified at this time  Skin:  Skin Assessment: Skin Integrity Issues: Skin Integrity Issues:: Stage II Stage II: Sacrum  Last BM:  Unknown  Height:   Ht Readings from Last 1 Encounters:  12/01/17 5' 6"  (1.676 m)    Weight:   Wt Readings from Last 1 Encounters:  12/12/17 165 lb 2 oz (74.9 kg)    Ideal Body Weight:  59.1 kg  BMI:  Body mass index is 26.65 kg/m.  Estimated Nutritional Needs:   Kcal:  1811 kcals   Protein:  113-150 g   Fluid:  Per MD goals   Kerman Passey MS, RD, LDN, CNSC [phone] Pager  [phone]  Weekend/On-Call Pager

## 2017-12-12 NOTE — Progress Notes (Signed)
ANTICOAGULATION CONSULT NOTE - Follow Up Consult  Pharmacy Consult for heparin Indication: atrial fibrillation  Labs: Recent Labs    12/10/17 0315 12/11/17 0235 12/11/17 1345 12/12/17 0029  HGB 7.6* 7.5*  --  7.3*  HCT 26.0* 25.3*  --  24.8*  PLT 189 205  --  237  LABPROT 14.8 16.2*  --  17.8*  INR 1.17 1.31  --  1.48  HEPARINUNFRC 0.27* 0.19* 1.10* 0.44  CREATININE 2.80* 3.50*  --  4.21*    Assessment/Plan:  71yo female therapeutic on heparin after rate change. Will continue gtt at current rate and confirm stable with am labs.   Vernard GamblesVeronda Laterrian Hevener, PharmD, BCPS  12/12/2017,1:01 AM

## 2017-12-12 NOTE — Progress Notes (Signed)
HD treatment summary note 0925-treatment initiated per policy, patient at baseline. 0945-BP drop to 83/42, validated BP, UF off, 100 cc NS bolus given.  O2 sats declined also to 91-92%.  Doristine LocksA Jarrell, RN updated regarding BP and O2 saturation levels.  Doristine LocksA Jarrell, RN adjusted vent settings and satuations improved to 94-95% 0955-Patient heart rate noted @ 43.  Doristine LocksA Jarrell, RN notified, Dr. Kendrick FriesMcQuaid on unit and notified.  HD trmt ended immediately and system rinsed back..  BP 65/29.   1000-Heart rate returned to 80's and BP 82/44.   1005-Dr. Signe ColtUpton notified of above events.  Agreed with termination of trmt.   1030-Report given to Doristine LocksA Jarrell, RN.

## 2017-12-12 NOTE — Progress Notes (Signed)
Called to bedside emergently by dialysis RN. Patient became agonal in respirations with SPO2 in the mid to high 80's. I placed the patient back on full support (as she was weaning on 40% and 10 of PS). RT Nikki made aware. Patient resumed 100% SPO2 and patient stabilized.   Approximately 10 minutes later, the patient became bradycardic with a HR in the 30's, hypotensive in the 50's,  patient was ashen in appearance and she began agonal breathing. HD stopped, precedex held, and MD McQuaid came to bedside. Order to stop HD, get an ABG, and McQuaid to call family to speak about the inability to dialyze patient.   After HD was stopped, the patient stabilized with HR and BP returning to baseline. Waiting ABG results.  Will monitor  Sheila Cabrera, Sheila Cabrera

## 2017-12-12 NOTE — Progress Notes (Signed)
PMT consult received, chart reviewed, and discussed with Dr. Kendrick FriesMcQuaid. Patient assessment completed. No family at bedside. Spoke with son, Jill AlexandersJustin, via telephone. Jill AlexandersJustin will be at the hospital tomorrow morning around 10am. Palliative NP will be present for GOC meeting with Dr. Kendrick FriesMcQuaid.   NO CHARGE  Vennie HomansMegan Antonin Meininger, FNP-C Palliative Medicine Team  Phone: [phone] Fax: [phone]

## 2017-12-12 NOTE — Progress Notes (Signed)
Critical troponin received of 0.10. MD McQuaid made aware, no new orders at this time  Sheila Cabrera, Gorden Stthomas Denise

## 2017-12-12 NOTE — Progress Notes (Signed)
Inpatient Diabetes Program Recommendations  AACE/ADA: New Consensus Statement on Inpatient Glycemic Control (2015)  Target Ranges:  Prepandial:   less than 140 mg/dL      Peak postprandial:   less than 180 mg/dL (1-2 hours)      Critically ill patients:  140 - 180 mg/dL   Lab Results  Component Value Date   GLUCAP 236 (H) 12/12/2017    Review of Glycemic Control Results for Sheila AdlerHOBSON, Lorena (MRN [id number]) as of 12/12/2017 11:35  Ref. Range 12/12/2017 03:14 12/12/2017 07:29 12/12/2017 11:24  Glucose-Capillary Latest Ref Range: 65 - 99 mg/dL 782201 (H) 956201 (H) 213236 (H)   Diabetes history: Type 2 DM Outpatient Diabetes medications: Levemir 10 units BID, Regular Q6H Current orders for Inpatient glycemic control: Lantus 35 units QD, Novolog 0-15 units Q4H, Solucortef 50 mg Q6H  Inpatient Diabetes Program Recommendations:    Noted event this AM requiring HD discontinuation. If appropriate and tube feeds to continue, consider adding Novolog 3 units Q4H for tube feed coverage.   Thanks, Lujean RaveLauren Abron Neddo, MSN, RNC-OB Diabetes Coordinator [phone] (8a-5p)

## 2017-12-12 NOTE — Progress Notes (Signed)
ANTICOAGULATION CONSULT NOTE  Pharmacy Consult for heparin Indication: atrial fibrillation  Patient Measurements: Height: 5\' 6"  (167.6 cm)(Per [facility] documents) Weight: 165 lb 2 oz (74.9 kg) IBW/kg (Calculated) : 59.3 Heparin Dosing Weight: 75.8 kg  Vital Signs: Temp: 99.3 F (37.4 C) (06/19 1100) Temp Source: Oral (06/19 1100) BP: 132/50 (06/19 1300) Pulse Rate: 80 (06/19 1300)  Labs: Recent Labs    12/10/17 0315 12/11/17 0235 12/11/17 1345 12/12/17 0029  HGB 7.6* 7.5*  --  7.3*  HCT 26.0* 25.3*  --  24.8*  PLT 189 205  --  237  LABPROT 14.8 16.2*  --  17.8*  INR 1.17 1.31  --  1.48  HEPARINUNFRC 0.27* 0.19* 1.10* 0.44  CREATININE 2.80* 3.50*  --  4.21*   Assessment: 72 yo female previously on Eliquis and was recently transitioned to warfarin - as documented in shadow chart. INR remained therapeutic for several days without receiving any anticoagulation. Heparin was initiated when INR fell < 2.  IV heparin at 950 units/hr. HL this afternoon remains therapeutic at 0.54. . INR this AM has trended up to 1.48 after 4 doses of warfarin. H/H low stable. Plt improved   Goal of Therapy:  Heparin level 0.3-0.7 units/ml Monitor platelets by anticoagulation protocol: Yes  INR 2-3    Plan:  Continue IV heparin at 950 units/hr  Warfarin 7.5 mg x 1 dose today  Daily heparin level and CBC   Thanks for allowing pharmacy to be a part of this patient's care.  Vinnie LevelBenjamin Chijioke Lasser, PharmD., BCPS Clinical Pharmacist Clinical phone for 12/12/17 until 3:30pm: [phone] If after 3:30pm, please call main pharmacy at: [phone]

## 2017-12-12 NOTE — Progress Notes (Addendum)
[facility] Progress Note   Assessment/ Plan:   1. ESRD, recent start of HD prior to admission at [facility], has R IJ tunneled HD cath.  Unfortunately she has not been able to UF with IHD-- she is now off pressors and off CRRT for several days.  Will attempt IHD today 6/19. 2. Hypervolemia/Pulmonoary Edema/VDRF/COPD; trach 6/12; weight down 40lb from admission 3. Anemia of CKD; recent IV Fe, on aranesp 16mg qSat, will give extra dose today 4. Hypotension, ? Septic shock: still on pressors, on appropriate antibiotics, resp culture with rare psuedomonas, blood cultures negative-->has been changed to cefepime from CTX. 5. AFib on anticoagulation, systemic heparin, warfarin resumed 6. Hx/o CVA with L sided hemiplegia, PEG tube  7. DM2 8. CKDBMD 9. Dispo: prognosis is poor overall, will see if can tolerate IHD.  Came from [facility], ultimate plan to go back there I think  ADDEND: pt was 24 min into dialysis this AM and experienced profound hypotension and bradycardia.  Rinsed back immediately.  Hemodynamics improved immediately following rinseback.  Risks of HD outweigh benefits and she cannot tolerate IHD.  Will no longer offer this intervention.  CRRT would not be useful in this case either.  I fully support pall care meeting.   Subjective:    Off levophed- for IHD today.  Sleeping this AM     Objective:   BP (!) 105/49   Pulse 63   Temp 98.7 F (37.1 C) (Oral)   Resp 20   Ht _0  (1.676 m) Comment: Per [facility] documents  Wt 74 kg (163 lb 2.3 oz)   LMP  (LMP Unknown)   SpO2 100%   BMI 26.33 kg/m   Physical Exam: GEN: Chronically Ill Appearing, trach on vent, cachectic, sleeping ACCESS: R IJ Tunneled HD cath, no erythema or drainage CV: irregular, no rub PULM: Coarse BS b/l EXT: 1+ upper extremity edema, largely unchanged   Labs: BMET Recent Labs  Lab 12/06/17 1522 12/07/17 0434 12/08/17 0500 12/08/17 1509 12/09/17 0419 12/10/17 0315 12/11/17 0235  12/12/17 0029  NA 139 135 138 142 140 141 140 140  K 3.9 3.4* 3.6 3.7 3.6 3.9 3.7 3.8  CL 102 105 102 104 102 104 101 103  CO2 _1 GLUCOSE 165* 235* 154* 192* 240* 263* 223* 237*  BUN 29* 26* 24* 35* 58* 105* 150* 187*  CREATININE 0.70 0.82 0.75 1.19* 1.81* 2.80* 3.50* 4.21*  CALCIUM 9.1 8.1* 9.1 9.4 9.3 9.7 9.8 9.6  PHOS 1.6* 1.5* 2.0* 1.9* 2.6 3.4 4.4  --    CBC Recent Labs  Lab 12/09/17 0419 12/10/17 0315 12/11/17 0235 12/12/17 0029  WBC 22.2* 25.3* 25.1* 21.0*  HGB 7.6* 7.6* 7.5* 7.3*  HCT 26.5* 26.0* 25.3* 24.8*  MCV 98.9 95.9 94.4 94.3  PLT 182 189 205 237    _2 @ Medications:    . arformoterol  15 mcg Nebulization BID  . atorvastatin  80 mg Per Tube q1800  . budesonide (PULMICORT) nebulizer solution  0.5 mg Nebulization BID  . chlorhexidine gluconate (MEDLINE KIT)  15 mL Mouth Rinse BID  . Chlorhexidine Gluconate Cloth  6 each Topical Daily  . collagenase   Topical Daily  . darbepoetin (ARANESP) injection - NON-DIALYSIS  60 mcg Subcutaneous Q Sat-1800  . escitalopram  20 mg Per Tube Daily  . feeding supplement (PRO-STAT SUGAR FREE 64)  30 mL Per Tube 5 X Daily  . folic acid  1 mg Per  Tube Daily  . hydrocortisone sod succinate (SOLU-CORTEF) inj  50 mg Intravenous Q6H  . insulin aspart  0-15 Units Subcutaneous Q4H  . insulin glargine  35 Units Subcutaneous Daily  . mouth rinse  15 mL Mouth Rinse Q4H  . midodrine  10 mg Oral Q8H  . pantoprazole sodium  40 mg Per Tube Daily  . QUEtiapine  50 mg Per Tube BID  . thiamine  100 mg Per Tube Daily  . traZODone  100 mg Per Tube QHS  . Warfarin - Pharmacist Dosing Inpatient   Does not apply q1800    Madelon Lips, MD 12/12/2017, 9:38 AM

## 2017-12-12 NOTE — Progress Notes (Signed)
PULMONARY / CRITICAL CARE MEDICINE   Name: Sheila Cabrera MRN: [id number] DOB: 04-16-1946    ADMISSION DATE:  11/30/2017 CONSULTATION DATE:  11/30/2017  REFERRING MD:  [facility]  CHIEF COMPLAINT:  Respiratory distress  HISTORY OF PRESENT ILLNESS:   72 y/o female presented to [facility] on 10/29/17 with pneumonia, transferred to [facility], had a GI bleed, had bilateral cortical strokes, later transferred to [facility], intubated there.  Moved to [facility] for CRRT.  Has baseline COPD, Afib, HFpEF, ESRD, CVA, dysphagia.   SUBJECTIVE:  This morning started hemodialysis: about 20 minutes in she developed profound bradycardia and hypotension.  HD stopped and within seconds of returning blood the patient's heart rate and blood pressure improved.   VITAL SIGNS: BP (!) 68/53   Pulse 89   Temp 99 F (37.2 C) (Oral)   Resp (!) 23   Ht 5\' 6"  (1.676 m) Comment: Per [facility] documents  Wt 165 lb 2 oz (74.9 kg)   LMP  (LMP Unknown)   SpO2 94%   BMI 26.65 kg/m   HEMODYNAMICS:    VENTILATOR SETTINGS: Vent Mode: PCV FiO2 (%):  [40 %] 40 % Set Rate:  [16 bmp-136 bmp] 16 bmp PEEP:  [5 cmH20] 5 cmH20 Pressure Support:  [10 cmH20] 10 cmH20 Plateau Pressure:  [16 cmH20-19 cmH20] 17 cmH20  INTAKE / OUTPUT: I/O last 3 completed shifts: In: 3248.8 [I.V.:1298.8; Other:180; NG/GT:1620; IV Piggyback:150] Out: 400 [Stool:400]  PHYSICAL EXAMINATION:  General:  In bed on vent, increased work of breathing HENT: NCAT trach in place PULM: CTA B, vent supported breathing CV: RRR, no mgr GI: BS+, soft, nontender MSK: normal bulk and tone Neuro: minimally responsive today    LABS:  BMET Recent Labs  Lab 12/10/17 0315 12/11/17 0235 12/12/17 0029  NA 141 140 140  K 3.9 3.7 3.8  CL 104 101 103  CO2 26 23 23   BUN 105* 150* 187*  CREATININE 2.80* 3.50* 4.21*  GLUCOSE 263* 223* 237*    Electrolytes Recent Labs  Lab 12/09/17 0419 12/10/17 0315 12/11/17 0235 12/12/17 0029  CALCIUM  9.3 9.7 9.8 9.6  MG 2.5* 2.5* 2.6*  --   PHOS 2.6 3.4 4.4  --     CBC Recent Labs  Lab 12/10/17 0315 12/11/17 0235 12/12/17 0029  WBC 25.3* 25.1* 21.0*  HGB 7.6* 7.5* 7.3*  HCT 26.0* 25.3* 24.8*  PLT 189 205 237    Coag's Recent Labs  Lab 12/10/17 0315 12/11/17 0235 12/12/17 0029  INR 1.17 1.31 1.48    Sepsis Markers No results for input(s): LATICACIDVEN, PROCALCITON, O2SATVEN in the last 168 hours.  ABG Recent Labs  Lab 12/06/17 0516 12/08/17 0605 12/12/17 1013  PHART 7.342* 7.381 7.263*  PCO2ART 48.6* 44.1 52.0*  PO2ART 70.0* 56.0* 65.5*    Liver Enzymes Recent Labs  Lab 12/06/17 0426  12/09/17 0419 12/10/17 0315 12/11/17 0235  AST 29  --   --   --   --   ALT 16  --   --   --   --   ALKPHOS 94  --   --   --   --   BILITOT 0.6  --   --   --   --   ALBUMIN 2.9*   < > 2.6* 2.6* 2.6*   < > = values in this interval not displayed.    Cardiac Enzymes No results for input(s): TROPONINI, PROBNP in the last 168 hours.  Glucose Recent Labs  Lab 12/11/17 1129 12/11/17 1540  12/11/17 1910 12/11/17 2337 12/12/17 0314 12/12/17 0729  GLUCAP 199* 171* 226* 202* 201* 201*    Imaging No results found.   STUDIES:  Echo 6/08 >> EF 65 to 70%, grade 1 DD  CULTURES: Blood 6/7 >> negative Sputum 6/7 >> Proteus Blood 6/16 >> Sputum 6/16 >>   ANTIBIOTICS: 6/08 Zosyn>> 6/10 6/08 vancomycin>> 6/10 6/11 Rocephin >> 6/14 Rocephin 6/16 >>   SIGNIFICANT EVENTS: 6/07 [facility]  6/08 started on dobutamine drip, cardiology consulted 6/15 transition off CRRT 6/16 recurrent fever  LINES/TUBES: CVC Left IJ 6/7 (OSH) >>  ETT 6/6 (OSH) >> 6/12 Right IJ Tunneled HD >>  Trach 6/12 >>    DISCUSSION: 72 y/o female with multiple medical problems here with septic shock, chronic respiratory failure requiring mechanical ventilation.  She has ESRD yet is not tolerant of hemodialysis.  Prognosis poor.  ASSESSMENT /  PLAN:  PULMONARY A: Acute respiratory failure with hypoxemia COPD Tracheostomy status Respiratory acidosis P:   Full mechanical vent support VAP prevention Daily WUA/SBT Increase RR to 24 Continue brovana/pulmicort  CARDIOVASCULAR A:  Refractory shock> improved with hydrocortisone and midodrine initially but now worse 6/19 Hypotension Afib paroxysmal P:  12 lead now, troponin now to assess for ischemia after this mornings episode with HD Tele Continue levophed for SBP > 90 Continue midodrine and hydrocortisone Continue heparin infusion  RENAL A:   ESRD> intolerant of HD Respiratory acidosis P:   Cannot perform more HD Monitor BMET and UOP Replace electrolytes as needed  GASTROINTESTINAL A:   No acute issues P:   Continue tube feeding Continue medical stress ulcer prophylaxis  HEMATOLOGIC A:   Anemia of chronic disease P:  Monitor for bleeding Transfuse PRBC for Hgb < 7 gm/dL  INFECTIOUS A:   Health care associated pneumonia > proteus DVT prophylaxis P:   Continue ceftriaxone, plan 14 days  ENDOCRINE A:   DM2   P:   Continue lantus and SSI  NEUROLOGIC A:   Acute encephalopathy P:   RASS goal 0 PAD protocol: wean off precedex as able   FAMILY  - Updates: I called [name] to [facility] this morning and let him know about this morning's episode of a near arrest with hemodialysis.  We are at at point where we cannot confidently offer ongoing life support without causing harm. I explained to him that we need to have a goals of care conversation with pallaitive care, he requested we do that tomorrow.  For now he desires her code status to be full code.    Inter-disciplinary family meet or Palliative Care meeting due by:  Day 7  My cc time 45 minutes  Heber CarolinaBrent McQuaid, MD [facility] Pager: [phone] Cell: [phone] After 3pm or if no response, call [phone]   12/12/2017, 11:18 AM

## 2017-12-13 DIAGNOSIS — Z9911 Dependence on respirator [ventilator] status: Secondary | ICD-10-CM

## 2017-12-13 DIAGNOSIS — Z515 Encounter for palliative care: Secondary | ICD-10-CM

## 2017-12-13 DIAGNOSIS — F411 Generalized anxiety disorder: Secondary | ICD-10-CM

## 2017-12-13 LAB — HEPATITIS B CORE ANTIBODY, TOTAL: HEP B C TOTAL AB: NEGATIVE

## 2017-12-13 LAB — CBC
HCT: 25.4 % — ABNORMAL LOW (ref 36.0–46.0)
Hemoglobin: 7.7 g/dL — ABNORMAL LOW (ref 12.0–15.0)
MCH: 28.2 pg (ref 26.0–34.0)
MCHC: 30.3 g/dL (ref 30.0–36.0)
MCV: 93 fL (ref 78.0–100.0)
PLATELETS: 289 10*3/uL (ref 150–400)
RBC: 2.73 MIL/uL — ABNORMAL LOW (ref 3.87–5.11)
RDW: 19.5 % — AB (ref 11.5–15.5)
WBC: 26.1 10*3/uL — ABNORMAL HIGH (ref 4.0–10.5)

## 2017-12-13 LAB — GLUCOSE, CAPILLARY
GLUCOSE-CAPILLARY: 151 mg/dL — AB (ref 65–99)
GLUCOSE-CAPILLARY: 192 mg/dL — AB (ref 65–99)
GLUCOSE-CAPILLARY: 237 mg/dL — AB (ref 65–99)
GLUCOSE-CAPILLARY: 281 mg/dL — AB (ref 65–99)

## 2017-12-13 LAB — HEPATITIS B SURFACE ANTIBODY,QUALITATIVE: HEP B S AB: NONREACTIVE

## 2017-12-13 LAB — PROTIME-INR
INR: 1.43
PROTHROMBIN TIME: 17.4 s — AB (ref 11.4–15.2)

## 2017-12-13 LAB — HEPARIN LEVEL (UNFRACTIONATED): HEPARIN UNFRACTIONATED: 0.5 [IU]/mL (ref 0.30–0.70)

## 2017-12-13 LAB — HEPATITIS B SURFACE ANTIGEN: HEP B S AG: NEGATIVE

## 2017-12-13 MED ORDER — WARFARIN SODIUM 5 MG PO TABS
10.0000 mg | ORAL_TABLET | Freq: Once | ORAL | Status: AC
  Administered 2017-12-13: 10 mg via ORAL
  Filled 2017-12-13: qty 2

## 2017-12-13 MED ORDER — FENTANYL BOLUS VIA INFUSION
25.0000 ug | INTRAVENOUS | Status: DC | PRN
  Administered 2017-12-14 – 2017-12-16 (×11): 25 ug via INTRAVENOUS
  Filled 2017-12-13: qty 25

## 2017-12-13 MED ORDER — GLYCOPYRROLATE 0.2 MG/ML IJ SOLN: 0.2000 mg | INTRAMUSCULAR | Status: DC | PRN

## 2017-12-13 NOTE — Progress Notes (Signed)
Titrated FIO2 down based on sats.

## 2017-12-13 NOTE — Progress Notes (Signed)
FIO2 increased to 50% due to patient desating.

## 2017-12-13 NOTE — Consult Note (Signed)
Consultation Note Date: 12/13/2017   Patient Name: Sheila Cabrera  DOB: 1945/11/18  MRN: [id number]  Age / Sex: 72 y.o., female  PCP: No primary care provider on file. Referring Physician: Juanito Doom, MD  Reason for Consultation: Establishing goals of care  HPI/Patient Profile: 72 y.o. female  with past medical history of CVA, PAF, GIB, essential hypertension, DM, COPD, CKD, and HFpEF admitted on 11/30/2017. Patient presented to [facility] on 10/29/17 with hypoxia/AMS secondary to pneumonia requiring intubation. Transferred to [facility]. Eliquis was held due to GIB. During this time, patient developed bilateral cortical strokes. With previous history of CKD stage 4 which progressed to ESRD requiring hemodialysis. Eventually extubated using BiPAP and transferred to [facility] due to inability to wean from BiPAP. On 11/30/17 patient intubated due to respiratory distress and was transferred to [facility] with bradycardia and hypotension during dialysis. Patient found to be in septic shock secondary to health care associated pneumonia (proteus) receiving IV antibiotics. Tracheostomy placed on 6/12 and remains on full mechanical ventilation support. ESRD requiring CRRT for several days. IHD attempted on 6/19 and patient experienced a near-arrest event. Nephrology following. Risks of IHD outweigh benefits and will no longer offer this intervention. Not a candidate for further CRRT due to no definable end point. Palliative medicine consultation for goals of care in patient with poor prognosis.   Clinical Assessment and Goals of Care: I have reviewed medical records, discussed with care team including Dr. Lake Cabrera, and met with son Sheila Cabrera) at bedside to discuss diagnosis, prognosis, GOC, EOL wishes, disposition and options. Patient wakes to voice but intermittently agitated and spitting. Remains with trach  on full support mechanical ventilation.   Introduced Palliative Medicine as specialized medical care for people living with serious illness. It focuses on providing relief from the symptoms and stress of a serious illness.   We discussed a brief life review of the patient. Sheila Cabrera shares that is mother is originally from [place] but lived in [place] for many decades of her life. She moved back to [place] to care for family. At this time, Sheila Cabrera was still in high school and had to move with his mother. His two siblings are still in [place].   [name] tearful during our conversation. He shares experiences with the loss of his grandmother and then his father, who passed away at [facility] with similar concerns. Sheila Cabrera shares his fear of being alone and his ability to pay off his mother's house once she passes. He shares that his mother is his "rock" and has no other local support from family or friends when she passes away. Siblings are in [place] but he only communicates with them when related to his mother's clinical status.   Sheila Cabrera shares how quickly she has declined since May. He does speak of poor nutritional status beginning in November and noticing a small decline before May. It has been challenging for Sheila Cabrera to visit [facility] because he recalls events from his father's death a few years ago.  Discussed course of hospitalization and interventions. Reiterated his conversation with Dr. Lake Cabrera this morning in that risks for further HD are much greater than the benefits. That nephrology has determined she is no longer a candidate for HD or CRRT. Confirmed plan for DNR and no further escalation of care. Sheila Cabrera speaks of wanting "nature to take its course" and her to die "naturally" but also not considering recommendation to withdraw vent or tube feeds. Prepared Sheila Cabrera that at this point he needs to be prepared for 'anything to happen at any time' and this very well could be a period of days or less. Sheila Cabrera  confirms understanding of this.   Educated on goal to ensure comfort and relief from suffering by utilizing medications for symptom management. Also to ensure a dignified death.   Therapeutic listening and emotional support provided as Sheila Cabrera shares his fears and worries. Encouraged he consider bereavement counseling through a hospice agency if interested. Sheila Cabrera is also worried about calling off of work during his probation period. Reassured him that I am willing to complete FMLA paperwork or offer provider letter if needed. PMT contact information given.    SUMMARY OF RECOMMENDATIONS    DNR if cardiac arrest. Allow comfort and dignity at EOL.   Continue current interventions including trach/vent, tube feeds, and pressor. NO further escalation of care.  Patient is not a candidate for further HD or CRRT. Son understands this.  Continue medications for symptom management in order to relieve suffering.  I have prepared son, Sheila Cabrera, for prognosis of likely days with no further dialysis. He is tearful but accepting that she is very sick and will not survive this hospitalization. He agrees with utilizing medications as needed for comfort.  PMT will follow.  Code Status/Advance Care Planning:  DNR  Symptom Management:   Continue Fentanyl infusion  Fentanyl bolus via infusion 28mg q337m prn pain/dyspnea/air hunger  Versed 25m65mV q2h prn anxiety/agitation  Robinul 0.2mg73m q4h prn secretions  Palliative Prophylaxis:   Aspiration, Delirium Protocol, Frequent Pain Assessment, Oral Care and Turn Reposition  Psycho-social/Spiritual:   Desire for further Chaplaincy support: yes  Additional Recommendations: Caregiving  Support/Resources, Compassionate Wean Education and Education on Hospice  Prognosis:   Poor prognosis likely days with septic shock secondary to HCAP, ESRD with inability to tolerate intermittent HD, and chronic respiratory requiring trach/vent support.   Discharge  Planning: Anticipated Hospital Death      Primary Diagnoses: Present on Admission: . 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 Pulmonary edema   I have reviewed the medical record, interviewed the patient and family, and examined the patient. The following aspects are pertinent.  Past Medical History:  Diagnosis Date  . (HFpEF) heart failure with preserved ejection fraction (HCC)[place] a. 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  . CKD (chronic kidney disease), stage IV (HCC)[place] a. 10/2017 advanced to ESRD-->HD.  . COMarland KitchenD (chronic obstructive pulmonary disease) (HCC)[place] a. On home O2.  . 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 10/2017 CTA chest: mild coronary Ca2+.  . Diabetes mellitus without complication (HCC)[place]. Essential hypertension   . GIB (gastrointestinal  bleeding)    a. 10/2017 in setting of Eliquis Rx.  Marland Kitchen PAF (paroxysmal atrial fibrillation) ([place])    a. Prev on eliquis (CHA2DS2VASc = 7)-->stopped 10/2017 in setting of GIB.  . Stroke ([place])    a. 10/2017 MRI: acute cortical infarct in bilat parietal lobes, bilat post paramedian parietal lobes, and R occipital lobe. Small focal acute infarct in white matter of R cerebellar hemisphere.    Social History   Socioeconomic History  . Marital status: Single    Spouse name: Not on file  . Number of children: Not on file  . Years of education: Not on file  . Highest education level: Not on file  Occupational History  . Not on file  Social Needs  . Financial resource strain: Not on file  . Food insecurity:    Worry: Not on file    Inability: Not on file  . Transportation needs:    Medical: Not on file    Non-medical:  Not on file  Tobacco Use  . Smoking status: Never Smoker  . Smokeless tobacco: Never Used  Substance and Sexual Activity  . Alcohol use: Not on file  . Drug use: Not on file  . Sexual activity: Not on file  Lifestyle  . Physical activity:    Days per week: Not on file    Minutes per session: Not on file  . Stress: Not on file  Relationships  . Social connections:    Talks on phone: Not on file    Gets together: Not on file    Attends religious service: Not on file    Active member of club or organization: Not on file    Attends meetings of clubs or organizations: Not on file    Relationship status: Not on file  Other Topics Concern  . Not on file  Social History Narrative  . Not on file   No family history on file. Scheduled Meds: . alteplase  2 mg Intracatheter Once  . alteplase  2 mg Intracatheter Once  . arformoterol  15 mcg Nebulization BID  . atorvastatin  80 mg Per Tube q1800  . budesonide (PULMICORT) nebulizer solution  0.5 mg Nebulization BID  . chlorhexidine gluconate (MEDLINE KIT)  15 mL Mouth Rinse BID  . Chlorhexidine Gluconate Cloth  6 each Topical Daily  . collagenase   Topical Daily  . darbepoetin (ARANESP) injection - DIALYSIS  100 mcg Intravenous Q Wed-HD  . escitalopram  20 mg Per Tube Daily  . feeding supplement (PRO-STAT SUGAR FREE 64)  30 mL Per Tube BID  . folic acid  1 mg Per Tube Daily  . hydrocortisone sod succinate (SOLU-CORTEF) inj  50 mg Intravenous Q6H  . mouth rinse  15 mL Mouth Rinse Q4H  . midodrine  10 mg Oral Q8H  . pantoprazole sodium  40 mg Per Tube Daily  . QUEtiapine  50 mg Per Tube BID  . thiamine  100 mg Per Tube Daily  . traZODone  100 mg Per Tube QHS  . warfarin  10 mg Oral ONCE-1800  . Warfarin - Pharmacist Dosing Inpatient   Does not apply q1800   Continuous Infusions: . sodium chloride 10 mL/hr at 12/13/17 1600  . sodium chloride 10 mL/hr at 12/11/17 1152  . sodium chloride    . sodium chloride    . sodium chloride      . ceFEPime (MAXIPIME) IV Stopped (12/13/17 1518)  . feeding supplement (VITAL AF 1.2 CAL) 1,000 mL (12/13/17  [id number])  . fentaNYL infusion INTRAVENOUS 250 mcg/hr (12/13/17 1600)  . heparin 950 Units/hr (12/13/17 1600)  . norepinephrine (LEVOPHED) Adult infusion 10 mcg/min (12/13/17 1600)   PRN Meds:.sodium chloride, sodium chloride, sodium chloride, sodium chloride, sodium chloride, acetaminophen (TYLENOL) oral liquid 160 mg/5 mL, alteplase, docusate, fentaNYL, glycopyrrolate, heparin, ipratropium-albuterol, lidocaine (PF), lidocaine-prilocaine, magnesium hydroxide, metoprolol tartrate, midazolam, pentafluoroprop-tetrafluoroeth Medications Prior to Admission:  Prior to Admission medications   Medication Sig Start Date End Date Taking? Authorizing Provider  acetaminophen (TYLENOL) 325 MG tablet Place 650 mg into feeding tube every 4 (four) hours as needed for mild pain, fever or headache.   Yes [provider]  Amino Acids-Protein Hydrolys (FEEDING SUPPLEMENT, PRO-STAT SUGAR FREE 64,) LIQD 30 mLs by Per J Tube route 3 (three) times daily with meals.   Yes [provider]  atorvastatin (LIPITOR) 80 MG tablet Place 80 mg into feeding tube daily at 6 PM.   Yes [provider]  b complex-vitamin c-folic acid (NEPHRO-VITE) 0.8 MG TABS tablet Place 1 tablet into feeding tube at bedtime.   Yes [provider]  Darbepoetin Alfa (ARANESP) 60 MCG/0.3ML SOSY injection Inject 60 mcg into the skin every 7 (seven) days.   Yes [provider]  escitalopram (LEXAPRO) 20 MG tablet Place 20 mg into feeding tube daily.   Yes [provider]  ferric gluconate 125 mg in sodium chloride 0.9 % 100 mL Inject 125 mg into the vein 3 (three) times a week.   Yes [provider]  folic acid (FOLVITE) 1 MG tablet Place 1 mg into feeding tube daily.   Yes [provider]  insulin detemir (LEVEMIR) 100 UNIT/ML injection Inject 10 Units into the skin every 12  (twelve) hours.   Yes [provider]  insulin regular (NOVOLIN R,HUMULIN R) 100 units/mL injection Inject into the skin every 6 (six) hours.   Yes [provider]  ipratropium-albuterol (DUONEB) 0.5-2.5 (3) MG/3ML SOLN Take 3 mLs by nebulization every 6 (six) hours.   Yes [provider]  lansoprazole (PREVACID) 15 MG capsule Place 15 mg into feeding tube daily at 12 noon.   Yes [provider]  metoprolol tartrate (LOPRESSOR) 25 MG tablet Place 25 mg into feeding tube 2 (two) times daily.   Yes [provider]  midodrine (PROAMATINE) 10 MG tablet Place 10 mg into feeding tube 3 (three) times daily.   Yes [provider]  Nutritional Supplements (FEEDING SUPPLEMENT, OSMOLITE 1.5 CAL,) LIQD Place 45 mL/hr into feeding tube continuous.   Yes [provider]  QUEtiapine (SEROQUEL) 25 MG tablet Place 25 mg into feeding tube at bedtime.   Yes [provider]  Soap & Cleansers ([employer] PERIANAL CLEANSING EX) Apply 1 application topically every 12 (twelve) hours as needed (itching).   Yes [provider]  thiamine 100 MG tablet Place 100 mg into feeding tube daily.   Yes [provider]  traZODone (DESYREL) 50 MG tablet Place 50 mg into feeding tube at bedtime as needed for sleep.   Yes [provider]  vitamin B-12 (CYANOCOBALAMIN) 100 MCG tablet Place 100 mcg into feeding tube daily.   Yes [provider]  warfarin (COUMADIN) 6 MG tablet Place 6 mg into feeding tube daily.   Yes [provider]   No Known Allergies Review of Systems  Unable to perform ROS: Acuity of condition   Physical Exam  Constitutional: She is easily aroused. She appears ill.  HENT:  Head: Normocephalic and atraumatic.  Cardiovascular: Tachycardia present.  Pulmonary/Chest: No accessory muscle usage. No tachypnea. No respiratory distress.  Trach/Vent FiO2 50%  Neurological: She is easily aroused.  Alert, ?  Orientation, grimacing/spitting  Skin: Skin is warm and dry.  Psychiatric: She is agitated.  Nursing note and vitals reviewed.  Vital Signs: BP (!) 125/51   Pulse (!) 103   Temp 99.2 F (37.3 C) (Oral)   Resp (!) 24   Ht 5' 6"  (1.676 m) Comment: Per [facility] documents  Wt 76.2 kg (167 lb 15.9 oz)   LMP  (LMP Unknown)   SpO2 90%   BMI 27.11 kg/m  Pain Scale: CPOT POSS *See Group Information*: 2-Acceptable,Slightly drowsy, easily aroused Pain Score: Asleep   SpO2: SpO2: 90 % O2 Device:SpO2: 90 % O2 Flow Rate: .   IO: Intake/output summary:   Intake/Output Summary (Last 24 hours) at 12/13/2017 1637 Last data filed at 12/13/2017 1600 Gross per 24 hour  Intake 2197.57 ml  Output 500 ml  Net 1697.57 ml    LBM: Last BM Date: 12/12/17 Baseline Weight: Weight: 87.6 kg (193 lb 2 oz) Most recent weight: Weight: 76.2 kg (167 lb 15.9 oz)     Palliative Assessment/Data: PPS 30%   Flowsheet Rows     Most Recent Value  Intake Tab  Referral Department  Critical care  Unit at Time of Referral  ICU  Palliative Care Primary Diagnosis  Nephrology  Palliative Care Type  New Palliative care  Reason for referral  Clarify Goals of Care  Date first seen by Palliative Care  12/12/17  Clinical Assessment  Palliative Performance Scale Score  30%  Psychosocial & Spiritual Assessment  Palliative Care Outcomes  Patient/Family meeting held?  Yes  Who was at the meeting?  son  Palliative Care Outcomes  Clarified goals of care, Provided psychosocial or spiritual support, Provided end of life care assistance, Changed CPR status, Improved pain interventions, Improved non-pain symptom therapy      Time In/Out: 1000-1020, 1130-1230 Time Total: 61mn Greater than 50%  of this time was spent counseling and coordinating care related to the above assessment and plan.  Signed by:  MIhor Dow FNP-C Palliative Medicine Team  Phone: [phone]: [phone]  Please contact  Palliative Medicine Team phone at [phone] questions and concerns.  For individual provider: See AShea Evans

## 2017-12-13 NOTE — Plan of Care (Cosign Needed)
Pt remains on pressor for BP control . Pt restless throughout the night .

## 2017-12-13 NOTE — Progress Notes (Signed)
ANTICOAGULATION CONSULT NOTE  Pharmacy Consult for heparin Indication: atrial fibrillation  Patient Measurements: Height: 5\' 6"  (167.6 cm)(Per [facility] documents) Weight: 167 lb 15.9 oz (76.2 kg) IBW/kg (Calculated) : 59.3 Heparin Dosing Weight: 75.8 kg  Vital Signs: Temp: 99.2 F (37.3 C) (06/20 0725) Temp Source: Oral (06/20 0725) BP: 98/48 (06/20 0759) Pulse Rate: 138 (06/20 0759)  Labs: Recent Labs    12/11/17 0235  12/12/17 0029 12/12/17 1353 12/13/17 0350  HGB 7.5*  --  7.3*  --  7.7*  HCT 25.3*  --  24.8*  --  25.4*  PLT 205  --  237  --  289  LABPROT 16.2*  --  17.8*  --  17.4*  INR 1.31  --  1.48  --  1.43  HEPARINUNFRC 0.19*   < > 0.44 0.54 0.50  CREATININE 3.50*  --  4.21*  --   --   TROPONINI  --   --   --  0.10*  --    < > = values in this interval not displayed.   Assessment: 72 yo female previously on Eliquis and was recently transitioned to warfarin - as documented in shadow chart. INR remained therapeutic for several days without receiving any anticoagulation. Heparin was initiated when INR fell < 2.  IV heparin at 950 units/hr. HL this AM remains therapeutic. INR this AM is relatively unchanged at 1.43 after 5 doses of warfarin. H/H low stable. Plt improved   Goal of Therapy:  Heparin level 0.3-0.7 units/ml Monitor platelets by anticoagulation protocol: Yes  INR 2-3    Plan:  Continue IV heparin at 950 units/hr  Warfarin 10 mg x 1 dose today  Daily heparin level and CBC  GOC discussion planned today   Thanks for allowing pharmacy to be a part of this patient's care.  Vinnie LevelBenjamin Pamella Samons, PharmD., BCPS Clinical Pharmacist Clinical phone for 12/13/17 until 3:30pm: [phone] If after 3:30pm, please call main pharmacy at: [phone]

## 2017-12-13 NOTE — Progress Notes (Signed)
PULMONARY / CRITICAL CARE MEDICINE   Name: Sheila Cabrera MRN: [id number] DOB: March 06, 1946    ADMISSION DATE:  11/30/2017 CONSULTATION DATE:  11/30/2017  REFERRING MD:  [facility]  CHIEF COMPLAINT:  Respiratory distress  HISTORY OF PRESENT ILLNESS:   72 y/o female presented to [facility] on 10/29/17 with pneumonia, transferred to [facility], had a GI bleed, had bilateral cortical strokes, later transferred to [place], intubated there.  Moved to [facility] for CRRT.  Has baseline COPD, Afib, HFpEF, ESRD, CVA, dysphagia.   SUBJECTIVE:  Remains on vasopressors  No acute events otherwise since near arrest yesterday with HD  VITAL SIGNS: BP (!) 126/54   Pulse (!) 123   Temp 99.2 F (37.3 C) (Oral)   Resp (!) 26   Ht 5' 6"  (1.676 m) Comment: Per [facility] documents  Wt 76.2 kg (167 lb 15.9 oz)   LMP  (LMP Unknown)   SpO2 96%   BMI 27.11 kg/m   HEMODYNAMICS:    VENTILATOR SETTINGS: Vent Mode: PCV FiO2 (%):  [40 %-50 %] 50 % Set Rate:  [24 bmp] 24 bmp PEEP:  [5 cmH20] 5 cmH20 Plateau Pressure:  [14 cmH20-18 cmH20] 18 cmH20  INTAKE / OUTPUT: I/O last 3 completed shifts: In: 3533.4 [I.V.:1618.4; Other:60; NG/GT:1705; IV Piggyback:150] Out: 404 [Stool:700]  PHYSICAL EXAMINATION:  General:  In bed on vent HENT: NCAT trach in place PULM: CTA B, vent supported breathing CV: RRR, no mgr GI: BS+, soft, nontender MSK: normal bulk and tone Neuro: sedated on vent     LABS:  BMET Recent Labs  Lab 12/10/17 0315 12/11/17 0235 12/12/17 0029  NA 141 140 140  K 3.9 3.7 3.8  CL 104 101 103  CO2 26 23 23   BUN 105* 150* 187*  CREATININE 2.80* 3.50* 4.21*  GLUCOSE 263* 223* 237*    Electrolytes Recent Labs  Lab 12/09/17 0419 12/10/17 0315 12/11/17 0235 12/12/17 0029  CALCIUM 9.3 9.7 9.8 9.6  MG 2.5* 2.5* 2.6*  --   PHOS 2.6 3.4 4.4  --     CBC Recent Labs  Lab 12/11/17 0235 12/12/17 0029 12/13/17 0350  WBC 25.1* 21.0* 26.1*  HGB 7.5* 7.3* 7.7*  HCT 25.3*  24.8* 25.4*  PLT 205 237 289    Coag's Recent Labs  Lab 12/11/17 0235 12/12/17 0029 12/13/17 0350  INR 1.31 1.48 1.43    Sepsis Markers No results for input(s): LATICACIDVEN, PROCALCITON, O2SATVEN in the last 168 hours.  ABG Recent Labs  Lab 12/08/17 0605 12/12/17 1013  PHART 7.381 7.263*  PCO2ART 44.1 52.0*  PO2ART 56.0* 65.5*    Liver Enzymes Recent Labs  Lab 12/09/17 0419 12/10/17 0315 12/11/17 0235  ALBUMIN 2.6* 2.6* 2.6*    Cardiac Enzymes Recent Labs  Lab 12/12/17 1353  TROPONINI 0.10*    Glucose Recent Labs  Lab 12/12/17 1552 12/12/17 1914 12/12/17 2321 12/13/17 0345 12/13/17 0738 12/13/17 1137  GLUCAP 244* 141* 136* 151* 192* 237*    Imaging No results found.   STUDIES:  Echo 6/08 >> EF 65 to 70%, grade 1 DD  CULTURES: Blood 6/7 >> negative Sputum 6/7 >> Proteus Blood 6/16 >> Sputum 6/16 >>   ANTIBIOTICS: 6/08 Zosyn>> 6/10 6/08 vancomycin>> 6/10 6/11 Rocephin >> 6/14 Rocephin 6/16 >>   SIGNIFICANT EVENTS: 6/07 Transported to [facility]  6/08 started on dobutamine drip, cardiology consulted 6/15 transition off CRRT 6/16 recurrent fever 6/18 off levophed (on midodrine and hydrocortisone max doses) 6/19 near arrest with intermittent HD 6/20 family conversation:  DNR, no escalation  LINES/TUBES: CVC Left IJ 6/7 (OSH) >>  ETT 6/6 (OSH) >> 6/12 Right IJ Tunneled HD >>  Trach 6/12 >>    DISCUSSION: 72 y/o female with multiple medical problems here with septic shock, chronic respiratory failure requiring mechanical ventilation.  She has ESRD yet is not tolerant of hemodialysis.  Prognosis poor.  Family has elected to not escalate care.   ASSESSMENT / PLAN:  PULMONARY A: Acute respiratory failure with hypoxemia COPD Tracheostomy status Respiratory acidosis P:   Full mechanical vent support VAP prevention Daily WUA/SBT Continue bronvana/pulmicort  CARDIOVASCULAR A:  Refractory shock> worse again 6/20  requiring levophed Hypotension Afib paroxysmal NSTEMI due to demand ischemia P:  Tele Continue levophed, don't titrate up Continue midodrine Continue hydrocortisone  RENAL A:   ESRD> intolerant of HD Respiratory acidosis P:   No more HD Not drawing labs anymore  GASTROINTESTINAL A:   No acute issues P:   Continue tube feeding Continue medical stress ulcer prophylaxis  HEMATOLOGIC A:   Anemia of chronic disease P:  Monitor for bleeding Transfuse PRBC for Hgb < 7 gm/dL  INFECTIOUS A:   Health care associated pneumonia > proteus DVT prophylaxis P:   Stop ceftriaxone at 14 days  ENDOCRINE A:   DM2   P:   Stop accuchecks and insulin  NEUROLOGIC A:   Acute toxic metabolic encephalopathy Recent strokes (occurred at outside hospital) P:   RASS goal 0 Continue fentanyl infusion, increase rate today    FAMILY  - Updates: I met with Larkin Ina this morning and we talked to his sister Albina Billet by phone.  I explained that she has a disease that man cannot cure and that we cannot perform HD again.  They voiced understanding.  They are reluctant to withdraw care right now but they don't want her to suffer.  We agreed to not escalate vasopressors or other support and will not perform CPR or Shocks in the event of a cardiac arrest.  I suspect that by withholding dialysis she will pass on her own over the next several days.  Discussed with palliative medicine   My cc time 45 minutes  Roselie Awkward, MD [facility] PCCM Pager: [phone] Cell: [phone] After 3pm or if no response, call [phone]   12/13/2017, 11:54 AM

## 2017-12-13 NOTE — Progress Notes (Signed)
RT removed trach sutures per MD order with no complications. RT performed trach care, changed trach ties and inserted new IC. Vitals are stable. RT will continue to monitor.

## 2017-12-13 NOTE — Progress Notes (Signed)
[facility] Progress Note   Assessment/ Plan:   1. ESRD, recent start of HD prior to admission at [facility], has R IJ tunneled HD cath.  Unfortunately she has not been able to UF with IHD-- she is now off pressors and off CRRT for several days.  IHD attempted 6/19 and she had a near-arrest event.  The risks of IHD outweigh the benefits and we will no longer offer this intervention.  We will not offer CRRT either as there would be no definable end point for CRRT as pt was unable to tolerate IHD.  Family meeting today 10 am.    2. Hypervolemia/Pulmonoary Edema/VDRF/COPD; trach 6/12  3. Anemia of CKD; recent IV Fe, on aranesp 74mg qSat, gave extra dose aranesp 6/19  4. Hypotension, ? Septic shock: still on pressors, on appropriate antibiotics, resp culture with rare psuedomonas, blood cultures negative-->has been changed to cefepime from CTX. 5. AFib on anticoagulation, systemic heparin, warfarin resumed  6. Hx/o CVA with L sided hemiplegia, PEG tube   7. Dispo: prognosis is poor overall, cannot tolerate IHD.  [place].  I rec hospice.  Subjective:    Didn't tolerate IHD;  24 min into dialysis 6/19 pt experienced profound hypotension and bradycardia with a near-arrest event.  Rinsed back immediately.  Hemodynamics improved immediately following rinseback.  On levophed this AM.  Family meeting today at 10 am.    Objective:   BP (!) 98/48   Pulse (!) 138   Temp 99.2 F (37.3 C) (Oral)   Resp (!) 27   Ht 5' 6"  (1.676 m) Comment: Per [facility] documents  Wt 76.2 kg (167 lb 15.9 oz)   LMP  (LMP Unknown)   SpO2 94%   BMI 27.11 kg/m   Physical Exam: GEN: Chronically Ill Appearing, trach on vent, intermittently grimacing, increased secretions ACCESS: R IJ Tunneled HD cath, no erythema or drainage CV: irregular, no rub PULM: Coarse BS b/l EXT: 1+ upper extremity edema, largely unchanged NEURO: grimacing, crying, appears to be in  pain  Labs: BMET Recent Labs  Lab 12/06/17 1522 12/07/17 0434 12/08/17 0500 12/08/17 1509 12/09/17 0419 12/10/17 0315 12/11/17 0235 12/12/17 0029  NA 139 135 138 142 140 141 140 140  K 3.9 3.4* 3.6 3.7 3.6 3.9 3.7 3.8  CL 102 105 102 104 102 104 101 103  CO2 27 23 26 29 27 26 23 23   GLUCOSE 165* 235* 154* 192* 240* 263* 223* 237*  BUN 29* 26* 24* 35* 58* 105* 150* 187*  CREATININE 0.70 0.82 0.75 1.19* 1.81* 2.80* 3.50* 4.21*  CALCIUM 9.1 8.1* 9.1 9.4 9.3 9.7 9.8 9.6  PHOS 1.6* 1.5* 2.0* 1.9* 2.6 3.4 4.4  --    CBC Recent Labs  Lab 12/10/17 0315 12/11/17 0235 12/12/17 0029 12/13/17 0350  WBC 25.3* 25.1* 21.0* 26.1*  HGB 7.6* 7.5* 7.3* 7.7*  HCT 26.0* 25.3* 24.8* 25.4*  MCV 95.9 94.4 94.3 93.0  PLT 189 205 237 289    @IMGRELPRIORS @ Medications:    . alteplase  2 mg Intracatheter Once  . alteplase  2 mg Intracatheter Once  . arformoterol  15 mcg Nebulization BID  . atorvastatin  80 mg Per Tube q1800  . budesonide (PULMICORT) nebulizer solution  0.5 mg Nebulization BID  . chlorhexidine gluconate (MEDLINE KIT)  15 mL Mouth Rinse BID  . Chlorhexidine Gluconate Cloth  6 each Topical Daily  . collagenase   Topical Daily  . darbepoetin (ARANESP) injection - DIALYSIS  100 mcg Intravenous Q Wed-HD  . escitalopram  20 mg Per Tube Daily  . feeding supplement (PRO-STAT SUGAR FREE 64)  30 mL Per Tube BID  . folic acid  1 mg Per Tube Daily  . hydrocortisone sod succinate (SOLU-CORTEF) inj  50 mg Intravenous Q6H  . insulin aspart  0-15 Units Subcutaneous Q4H  . insulin glargine  35 Units Subcutaneous Daily  . mouth rinse  15 mL Mouth Rinse Q4H  . midodrine  10 mg Oral Q8H  . pantoprazole sodium  40 mg Per Tube Daily  . QUEtiapine  50 mg Per Tube BID  . thiamine  100 mg Per Tube Daily  . traZODone  100 mg Per Tube QHS  . warfarin  10 mg Oral ONCE-1800  . Warfarin - Pharmacist Dosing Inpatient   Does not apply q1800    Madelon Lips, MD 12/13/2017, 8:54 AM

## 2017-12-14 LAB — CULTURE, BLOOD (ROUTINE X 2)
Culture: NO GROWTH
Culture: NO GROWTH
SPECIAL REQUESTS: ADEQUATE

## 2017-12-14 LAB — HEPARIN LEVEL (UNFRACTIONATED): HEPARIN UNFRACTIONATED: 0.5 [IU]/mL (ref 0.30–0.70)

## 2017-12-14 LAB — CBC
HCT: 23.9 % — ABNORMAL LOW (ref 36.0–46.0)
HEMOGLOBIN: 7.4 g/dL — AB (ref 12.0–15.0)
MCH: 28.2 pg (ref 26.0–34.0)
MCHC: 31 g/dL (ref 30.0–36.0)
MCV: 91.2 fL (ref 78.0–100.0)
Platelets: 291 10*3/uL (ref 150–400)
RBC: 2.62 MIL/uL — ABNORMAL LOW (ref 3.87–5.11)
RDW: 19.4 % — AB (ref 11.5–15.5)
WBC: 21.6 10*3/uL — AB (ref 4.0–10.5)

## 2017-12-14 LAB — PROTIME-INR
INR: 1.59
PROTHROMBIN TIME: 18.8 s — AB (ref 11.4–15.2)

## 2017-12-14 MED ORDER — CHLORHEXIDINE GLUCONATE 0.12% ORAL RINSE (MEDLINE KIT)
15.0000 mL | Freq: Two times a day (BID) | OROMUCOSAL | Status: DC
  Administered 2017-12-14 – 2017-12-19 (×11): 15 mL via OROMUCOSAL

## 2017-12-14 MED ORDER — WARFARIN SODIUM 5 MG PO TABS
10.0000 mg | ORAL_TABLET | Freq: Once | ORAL | Status: AC
  Administered 2017-12-14: 10 mg via ORAL
  Filled 2017-12-14: qty 2

## 2017-12-14 MED ORDER — ORAL CARE MOUTH RINSE
15.0000 mL | OROMUCOSAL | Status: DC
  Administered 2017-12-14 – 2017-12-19 (×55): 15 mL via OROMUCOSAL

## 2017-12-14 NOTE — Progress Notes (Signed)
PULMONARY / CRITICAL CARE MEDICINE   Name: Sheila Cabrera MRN: [id number] DOB: 09/12/1945    ADMISSION DATE:  11/30/2017 CONSULTATION DATE:  11/30/2017  REFERRING MD:  [facility]  CHIEF COMPLAINT:  Respiratory distress  HISTORY OF PRESENT ILLNESS:   72 y/o female presented to [facility] on 10/29/17 with pneumonia, transferred to [facility], had a GI bleed, had bilateral cortical strokes, later transferred to [facility], intubated there.  Moved to [facility] for CRRT.  Has baseline COPD, Afib, HFpEF, ESRD, CVA, dysphagia.   SUBJECTIVE:  Family conversation on 6/20> DNR, no escalation of care No acute events otherwise  VITAL SIGNS: BP (!) 103/43 (BP Location: Right Arm)   Pulse 78   Temp 98.6 F (37 C) (Oral)   Resp (!) 23   Ht 5\' 6"  (1.676 m) Comment: Per [facility] documents  Wt 78 kg (171 lb 15.3 oz)   LMP  (LMP Unknown)   SpO2 92%   BMI 27.75 kg/m   HEMODYNAMICS:    VENTILATOR SETTINGS: Vent Mode: PCV FiO2 (%):  [40 %] 40 % Set Rate:  [24 bmp] 24 bmp PEEP:  [3 cmH20-5 cmH20] 3 cmH20 Pressure Support:  [5 cmH20] 5 cmH20 Plateau Pressure:  [15 cmH20-17 cmH20] 15 cmH20  INTAKE / OUTPUT: I/O last 3 completed shifts: In: 3367.8 [I.V.:1942.8; Other:60; NG/GT:1265; IV Piggyback:100.1] Out: 750 [Stool:750]  PHYSICAL EXAMINATION:  General:  In bed on vent HENT: NCAT trach in place PULM: CTA B, vent supported breathing CV: RRR, no mgr GI: BS+, soft, nontender MSK: normal bulk and tone Neuro: sedated on vent      LABS:  BMET Recent Labs  Lab 12/10/17 0315 12/11/17 0235 12/12/17 0029  NA 141 140 140  K 3.9 3.7 3.8  CL 104 101 103  CO2 26 23 23   BUN 105* 150* 187*  CREATININE 2.80* 3.50* 4.21*  GLUCOSE 263* 223* 237*    Electrolytes Recent Labs  Lab 12/09/17 0419 12/10/17 0315 12/11/17 0235 12/12/17 0029  CALCIUM 9.3 9.7 9.8 9.6  MG 2.5* 2.5* 2.6*  --   PHOS 2.6 3.4 4.4  --     CBC Recent Labs  Lab 12/12/17 0029 12/13/17 0350 12/14/17 0457   WBC 21.0* 26.1* 21.6*  HGB 7.3* 7.7* 7.4*  HCT 24.8* 25.4* 23.9*  PLT 237 289 291    Coag's Recent Labs  Lab 12/12/17 0029 12/13/17 0350 12/14/17 0457  INR 1.48 1.43 1.59    Sepsis Markers No results for input(s): LATICACIDVEN, PROCALCITON, O2SATVEN in the last 168 hours.  ABG Recent Labs  Lab 12/08/17 0605 12/12/17 1013  PHART 7.381 7.263*  PCO2ART 44.1 52.0*  PO2ART 56.0* 65.5*    Liver Enzymes Recent Labs  Lab 12/09/17 0419 12/10/17 0315 12/11/17 0235  ALBUMIN 2.6* 2.6* 2.6*    Cardiac Enzymes Recent Labs  Lab 12/12/17 1353  TROPONINI 0.10*    Glucose Recent Labs  Lab 12/12/17 1914 12/12/17 2321 12/13/17 0345 12/13/17 0738 12/13/17 1137 12/13/17 2028  GLUCAP 141* 136* 151* 192* 237* 281*    Imaging No results found.   STUDIES:  Echo 6/08 >> EF 65 to 70%, grade 1 DD  CULTURES: Blood 6/7 >> negative Sputum 6/7 >> Proteus Blood 6/16 >> Sputum 6/16 >>   ANTIBIOTICS: 6/08 Zosyn>> 6/10 6/08 vancomycin>> 6/10 6/11 Rocephin >> 6/14 Rocephin 6/16 >>   SIGNIFICANT EVENTS: 6/07 Transported to [facility]  6/08 started on dobutamine drip, cardiology consulted 6/15 transition off CRRT 6/16 recurrent fever 6/18 off levophed (on midodrine and hydrocortisone max  doses) 6/19 near arrest with intermittent HD 6/20 family conversation: DNR, no escalation  LINES/TUBES: CVC Left IJ 6/7 (OSH) >>  ETT 6/6 (OSH) >> 6/12 Right IJ Tunneled HD >>  Trach 6/12 >>    DISCUSSION: 72 y/o female with multiple medical problems here with septic shock, chronic respiratory failure requiring mechanical ventilation.  She has ESRD yet is not tolerant of hemodialysis.  Prognosis poor.  Family has elected to not escalate care.   ASSESSMENT / PLAN:  PULMONARY A: Acute respiratory failure with hypoxemia COPD Tracheostomy status Respiratory acidosis P:   Full mechanical vent support VAP prevention Daily  WUA/SBT Brovana/pulmicort  CARDIOVASCULAR A:  Refractory shock> still requiring levophed Hypotension Afib paroxysmal NSTEMI due to demand ischemia P:  Tele Continue midodrine, hydrocortisone Titrate off levophed as BP able but don't add it back  RENAL A:   ESRD> intolerant of HD Respiratory acidosis P:   No more HD Stop drawing labs  GASTROINTESTINAL A:   No acute issues P: Continue tube feeding Continue medical stress ulcer prophylaxis  HEMATOLOGIC A:   Anemia of chronic disease P:  Monitor for bleeding Transfuse PRBC for Hgb < 7 gm/dL  INFECTIOUS A:   Health care associated pneumonia > proteus DVT prophylaxis P:   Stop ceftriaxone at 14 days  ENDOCRINE A:   DM2   P:   Stop accuchecks and insulin  NEUROLOGIC A:   Acute toxic metabolic encephalopathy Recent strokes (occurred at outside hospital) P:   RASS goal 0 Continue fentanyl infusino for comfort   FAMILY  - Updates: see discussion from 6/20 note.  The family has elected to not escalate care further. I suspect she will pass over the next 2-3 days.  CODE DNR:  Discussed with palliative medicine  My cc time 30 minutes   Heber CarolinaBrent Lyssa Hackley, MD [facility] Pager: [phone] Cell: [phone] After 3pm or if no response, call [phone]   12/14/2017, 12:48 PM

## 2017-12-14 NOTE — Progress Notes (Signed)
Chart reviewed.  Pt no escalation of care.  No further dialytic intervention will be offered.    We will sign off.  Please contact us with any questions or concerns.    Bufford ButtnerElizabeth Gervase Colberg MD [facility] pgr [phone]

## 2017-12-14 NOTE — Plan of Care (Signed)
Pt remains on pressors for BP control . Tolerating Vent well

## 2017-12-14 NOTE — Progress Notes (Signed)
ANTICOAGULATION CONSULT NOTE  Pharmacy Consult for heparin Indication: atrial fibrillation  Patient Measurements: Height: 5\' 6"  (167.6 cm)(Per [facility] documents) Weight: 171 lb 15.3 oz (78 kg) IBW/kg (Calculated) : 59.3 Heparin Dosing Weight: 75.8 kg  Vital Signs: Temp: 99.1 F (37.3 C) (06/21 0315) Temp Source: Oral (06/21 0315) BP: 121/78 (06/21 0800) Pulse Rate: 104 (06/21 0800)  Labs: Recent Labs    12/12/17 0029 12/12/17 1353 12/13/17 0350 12/14/17 0457  HGB 7.3*  --  7.7* 7.4*  HCT 24.8*  --  25.4* 23.9*  PLT 237  --  289 291  LABPROT 17.8*  --  17.4* 18.8*  INR 1.48  --  1.43 1.59  HEPARINUNFRC 0.44 0.54 0.50 0.50  CREATININE 4.21*  --   --   --   TROPONINI  --  0.10*  --   --    Assessment: 72 yo female previously on Eliquis and was recently transitioned to warfarin - as documented in shadow chart. INR remained therapeutic for several days without receiving any anticoagulation. Heparin was initiated when INR fell < 2.  IV heparin at 950 units/hr. HL this AM remains therapeutic. INR this AM has trended up to 1.59 after 6 doses of warfarin. H/H low stable. Plt improved   Goal of Therapy:  Heparin level 0.3-0.7 units/ml Monitor platelets by anticoagulation protocol: Yes  INR 2-3    Plan:  Continue IV heparin at 950 units/hr  Warfarin 10 mg x 1 dose today  Daily heparin level and CBC  GOC plans noted.   Thanks for allowing pharmacy to be a part of this patient's care.  Vinnie LevelBenjamin Priscella Donna, PharmD., BCPS Clinical Pharmacist Clinical phone for 12/14/17 until 3:30pm: [phone] If after 3:30pm, please call main pharmacy at: [phone]

## 2017-12-14 NOTE — Progress Notes (Signed)
Patient had vomiting episode at 0950 after receiving meds via PEG tube. Bile color, smells of tube feed. Tube feeds are now stopped. Patient O2 sats dropped to 80%. 100% Fi02 breaths were given by vent and inline suction. Sats up to 93%. Fentanyl gtt increased and bolus given for comfort.

## 2017-12-14 NOTE — Progress Notes (Signed)
Daily Progress Note   Patient Name: Sheila Cabrera       Date: 12/14/2017 DOB: Sep 30, 1945  Age: 72 y.o. MRN#: [id number] Attending Physician: Juanito Doom, MD Primary Care Physician: No primary care provider on file. Admit Date: 11/30/2017  Reason for Consultation/Follow-up: Establishing goals of care and Terminal Care  Subjective: Patient appears comfortable during visit. No s/s of distress. Remains on ventilator. TF on hold due to vomiting this AM. Fentanyl at 446mg/hr.  No family at bedside.   Length of Stay: 14  Current Medications: Scheduled Meds:  . alteplase  2 mg Intracatheter Once  . alteplase  2 mg Intracatheter Once  . arformoterol  15 mcg Nebulization BID  . atorvastatin  80 mg Per Tube q1800  . budesonide (PULMICORT) nebulizer solution  0.5 mg Nebulization BID  . chlorhexidine gluconate (MEDLINE KIT)  15 mL Mouth Rinse BID  . Chlorhexidine Gluconate Cloth  6 each Topical Daily  . collagenase   Topical Daily  . darbepoetin (ARANESP) injection - DIALYSIS  100 mcg Intravenous Q Wed-HD  . escitalopram  20 mg Per Tube Daily  . feeding supplement (PRO-STAT SUGAR FREE 64)  30 mL Per Tube BID  . folic acid  1 mg Per Tube Daily  . hydrocortisone sod succinate (SOLU-CORTEF) inj  50 mg Intravenous Q6H  . mouth rinse  15 mL Mouth Rinse 10 times per day  . midodrine  10 mg Oral Q8H  . pantoprazole sodium  40 mg Per Tube Daily  . QUEtiapine  50 mg Per Tube BID  . thiamine  100 mg Per Tube Daily  . traZODone  100 mg Per Tube QHS  . warfarin  10 mg Oral ONCE-1800  . Warfarin - Pharmacist Dosing Inpatient   Does not apply q1800    Continuous Infusions: . sodium chloride 10 mL/hr at 12/14/17 1400  . sodium chloride 10 mL/hr at 12/11/17 1152  . sodium chloride    . sodium  chloride    . sodium chloride    . ceFEPime (MAXIPIME) IV Stopped (12/14/17 1212)  . feeding supplement (VITAL AF 1.2 CAL) Stopped (12/14/17 0950)  . fentaNYL infusion INTRAVENOUS 400 mcg/hr (12/14/17 1400)  . heparin 950 Units/hr (12/14/17 1400)  . norepinephrine (LEVOPHED) Adult infusion 4 mcg/min (12/14/17 1400)    PRN Meds: sodium chloride, sodium  chloride, sodium chloride, sodium chloride, sodium chloride, acetaminophen (TYLENOL) oral liquid 160 mg/5 mL, alteplase, docusate, fentaNYL, glycopyrrolate, heparin, ipratropium-albuterol, lidocaine (PF), lidocaine-prilocaine, magnesium hydroxide, metoprolol tartrate, midazolam, pentafluoroprop-tetrafluoroeth  Physical Exam  Constitutional: She is sleeping. She is sedated and intubated.  HENT:  Head: Normocephalic.  Cardiovascular: Tachycardia present.  Pulmonary/Chest: No accessory muscle usage. No tachypnea. She is intubated. No respiratory distress.  Skin: Skin is warm and dry.  Psychiatric: Cognition and memory are impaired. She is noncommunicative. She is inattentive.  Nursing note and vitals reviewed.          Vital Signs: BP (!) 121/56   Pulse (!) 113   Temp 98.6 F (37 C) (Oral)   Resp 20   Ht 5' 6"  (1.676 m) Comment: Per [facility] documents  Wt 78 kg (171 lb 15.3 oz)   LMP  (LMP Unknown)   SpO2 (!) 88%   BMI 27.75 kg/m  SpO2: SpO2: (!) 88 % O2 Device: O2 Device: Ventilator O2 Flow Rate:    Intake/output summary:   Intake/Output Summary (Last 24 hours) at 12/14/2017 1436 Last data filed at 12/14/2017 1400 Gross per 24 hour  Intake 2555.45 ml  Output 250 ml  Net 2305.45 ml   LBM: Last BM Date: 12/14/17(Flexi seal) Baseline Weight: Weight: 87.6 kg (193 lb 2 oz) Most recent weight: Weight: 78 kg (171 lb 15.3 oz)       Palliative Assessment/Data: PPS 30%   Flowsheet Rows     Most Recent Value  Intake Tab  Referral Department  Critical care  Unit at Time of Referral  ICU  Palliative Care Primary  Diagnosis  Nephrology  Palliative Care Type  New Palliative care  Reason for referral  Clarify Goals of Care  Date first seen by Palliative Care  12/12/17  Clinical Assessment  Palliative Performance Scale Score  30%  Psychosocial & Spiritual Assessment  Palliative Care Outcomes  Patient/Family meeting held?  Yes  Who was at the meeting?  son  Palliative Care Outcomes  Clarified goals of care, Provided psychosocial or spiritual support, Provided end of life care assistance, Changed CPR status, Improved pain interventions, Improved non-pain symptom therapy      Patient Active Problem List   Diagnosis Date Noted  . Palliative care by specialist   . Terminal care   . Ventilator dependent ([place])   . Anxiety state   . Acute renal failure superimposed on stage 4 chronic kidney disease ([place]) 11/30/2017  . Pressure injury of skin 11/30/2017  . Acute and chronic respiratory failure with hypoxia ([place]) 11/30/2017  . COPD with emphysema ([place]) 11/30/2017  . Anemia of chronic disease 11/30/2017  . Pulmonary edema 11/30/2017    Palliative Care Assessment & Plan   Patient Profile: 72 y.o. female  with past medical history of CVA, PAF, GIB, essential hypertension, DM, COPD, CKD, and HFpEF admitted on 11/30/2017. Patient presented to [facility] on 10/29/17 with hypoxia/AMS secondary to pneumonia requiring intubation. Transferred to [facility]. Eliquis was held due to GIB. During this time, patient developed bilateral cortical strokes. With previous history of CKD stage 4 which progressed to ESRD requiring hemodialysis. Eventually extubated using BiPAP and transferred to [facility] due to inability to wean from BiPAP. On 11/30/17 patient intubated due to respiratory distress and was transferred to [facility] with bradycardia and hypotension during dialysis. Patient found to be in septic shock secondary to health care associated pneumonia (proteus) receiving IV antibiotics. Tracheostomy placed  on 6/12 and remains on full  mechanical ventilation support. ESRD requiring CRRT for several days. IHD attempted on 6/19 and patient experienced a near-arrest event. Nephrology following. Risks of IHD outweigh benefits and will no longer offer this intervention. Not a candidate for further CRRT due to no definable end point. Palliative medicine consultation for goals of care in patient with poor prognosis.   Assessment: Acute on chronic respiratory failure Ventilator dependence s/p tracheostomy COPD Refractory shock HCAP (proteus) ESRD with inability to tolerate further IHD Hx of CVA  Recommendations/Plan:  DNR if cardiac arrest. Allow comfort and dignity at EOL.   NO escalation of care. Continue current interventions.   Patient is not a candidate for further HD or CRRT.  I prepared son on 6/20 for prognosis of likely days with no further dialysis. He was tearful but accepting that she is very sick and will not survive this hospitalization. Son agrees with utilizing medications as needed to ensure comfort.   Symptom management  Continue fentanyl infusion  Fentanyl 57mg IV q328m prn pain/dyspnea/air hunger  Versed 34m45mV q2h prn anxiety/agitation  Robinul 0.2mg45m q4h prn secretions  Anticipate hospital death.   Code Status: DNR   Code Status Orders  (From admission, onward)        Start     Ordered   12/13/17 1227  Do not attempt resuscitation (DNR)  Continuous    Question Answer Comment  In the event of cardiac or respiratory ARREST Do not call a "code blue"   In the event of cardiac or respiratory ARREST Do not perform Intubation, CPR, defibrillation or ACLS   In the event of cardiac or respiratory ARREST Use medication by any route, position, wound care, and other measures to relive pain and suffering. May use oxygen, suction and manual treatment of airway obstruction as needed for comfort.      12/13/17 1226    Code Status History    Date Active Date Inactive  Code Status Order ID Comments User Context   11/30/2017 2056 12/13/2017 1226 Full Code [id number] Person Inpatient       Prognosis:   Likely days with septic shock secondary to HCAP, ESRD with inability to tolerate further intermittent HD. Chronic respiratory failure requiring trach/vent support.    Discharge Planning:  Anticipated Hospital Death  Care plan was discussed with RN  Thank you for allowing the Palliative Medicine Team to assist in the care of this patient.   Time In: 1150- Time Out: 1205 Total Time 15mi54molonged Time Billed  no      Greater than 50%  of this time was spent counseling and coordinating care related to the above assessment and plan.  MeganIhor Dow-C Palliative Medicine Team  Phone: [phone] contact Palliative Medicine Team phone at [phone] and concerns.

## 2017-12-15 LAB — HEPARIN LEVEL (UNFRACTIONATED): Heparin Unfractionated: 0.27 IU/mL — ABNORMAL LOW (ref 0.30–0.70)

## 2017-12-15 LAB — PROTIME-INR
INR: 1.66
PROTHROMBIN TIME: 19.5 s — AB (ref 11.4–15.2)

## 2017-12-15 MED ORDER — CEFEPIME HCL 1 G IJ SOLR
500.0000 mg | INTRAMUSCULAR | Status: DC
  Filled 2017-12-15 (×2): qty 0.5

## 2017-12-15 NOTE — Progress Notes (Addendum)
Pharmacy Antibiotic Note  Sheila Cabrera is a 72 y.o. female admitted on 11/30/2017 with pneumonia.  Pharmacy has been consulted for cefepime dosing. Trach aspirate growing pseudomonas. No further labs or HD planned.   Plan: -Cefepime 500 mg every 24 hours while no HD/anuric -Pharmacy will sign off and monitor peripherally -Planned 14 day course  Height: 5\' 6"  (167.6 cm)(Per [facility] documents) Weight: 174 lb 9.7 oz (79.2 kg) IBW/kg (Calculated) : 59.3  Temp (24hrs), Avg:98.2 F (36.8 C), Min:97.7 F (36.5 C), Max:98.6 F (37 C)  Recent Labs  Lab 12/08/17 1509  12/09/17 0419 12/10/17 0315 12/11/17 0235 12/12/17 0029 12/13/17 0350 12/14/17 0457  WBC  --    < > 22.2* 25.3* 25.1* 21.0* 26.1* 21.6*  CREATININE 1.19*  --  1.81* 2.80* 3.50* 4.21*  --   --    < > = values in this interval not displayed.    Estimated Creatinine Clearance: 13.2 mL/min (A) (by C-G formula based on SCr of 4.21 mg/dL (H)).    No Known Allergies   Cefepime 6/18>>  Ceftriaxone 6/11 > 6/15;6/16 > 1/8  Vanc 6/8 >> 6/11  Zosyn 6/8 >> 6/11  6/7 BCx>> NEG 6/7 MRSA PCR>>NEG 6/7 resp cx: proteus 6/16 TA > pseudomonas   Thank you for allowing pharmacy to be a part of this patient's care.  Sharin MonsEmily Cabrera, PharmD, BCPS PGY2 Infectious Diseases Pharmacy Resident Clinical phone for 12/15/17 until 3:30pm: [phone] If after 3:30pm, please call main pharmacy at: [phone]

## 2017-12-15 NOTE — Progress Notes (Signed)
PULMONARY / CRITICAL CARE MEDICINE   Name: Sheila [facility]rera MRN: [id number] DOB: September 09, 1945    ADMISSION DATE:  11/30/2017 CONSULTATION DATE:  11/30/2017  REFERRING MD:  [facility]  CHIEF COMPLAINT:  Respiratory distress  HISTORY OF PRESENT ILLNESS:   72 [facility], [facility], had a GI bleed, had bilateral cortical strokes, later transferred to [facility], intubated there.  Moved [facility].  Has baseline COPD, Afib, HFpEF, ESRD, CVA, dysphagia.   SUBJECTIVE:  Off pressors now .  Easily agitated.  Family conversation on 6/20 , DNR, no escalation of care   VITAL SIGNS: BP (!) 84/47   Pulse 65   Temp 98.2 F (36.8 C) (Oral)   Resp (!) 24   Ht 5\' 6"  (1.676 m) Comment: Per [facility] documents  Wt 174 lb 9.7 oz (79.2 kg)   LMP  (LMP Unknown)   SpO2 97%   BMI 28.18 kg/m   HEMODYNAMICS:    VENTILATOR SETTINGS: Vent Mode: PCV FiO2 (%):  [40 %] 40 % Set Rate:  [24 bmp] 24 bmp PEEP:  [5 cmH20] 5 cmH20 Pressure Support:  [5 cmH20] 5 cmH20 Plateau Pressure:  [15 cmH20-17 cmH20] 16 cmH20  INTAKE / OUTPUT: I/O last 3 completed shifts: In: 3347.1 [I.V.:2141.4; Other:290; NG/GT:815.8; IV Piggyback:99.9] Out: 250 [Stool:250]  PHYSICAL EXAMINATION:  General:  Ill appearing female on vent  HENT: NCAT , moist MM, trach midline  PULM: CTA , vent  CV: RRR , no m/r/g.  GI: BS + , soft, obese  MSK: intact  Neuro: sedated on vent , alert to voice and touch,      LABS:  BMET Recent Labs  Lab 12/10/17 0315 12/11/17 0235 12/12/17 0029  NA 141 140 140  K 3.9 3.7 3.8  CL 104 101 103  CO2 26 23 23   BUN 105* 150* 187*  CREATININE 2.80* 3.50* 4.21*  GLUCOSE 263* 223* 237*    Electrolytes Recent Labs  Lab 12/09/17 0419 12/10/17 0315 12/11/17 0235 12/12/17 0029  CALCIUM 9.3 9.7 9.8 9.6  MG 2.5* 2.5* 2.6*  --   PHOS 2.6 3.4 4.4  --     CBC Recent Labs  Lab 12/12/17 0029 12/13/17 0350  12/14/17 0457  WBC 21.0* 26.1* 21.6*  HGB 7.3* 7.7* 7.4*  HCT 24.8* 25.4* 23.9*  PLT 237 289 291    Coag's Recent Labs  Lab 12/13/17 0350 12/14/17 0457 12/15/17 0346  INR 1.43 1.59 1.66    Sepsis Markers No results for input(s): LATICACIDVEN, PROCALCITON, O2SATVEN in the last 168 hours.  ABG Recent Labs  Lab 12/12/17 1013  PHART 7.263*  PCO2ART 52.0*  PO2ART 65.5*    Liver Enzymes Recent Labs  Lab 12/09/17 0419 12/10/17 0315 12/11/17 0235  ALBUMIN 2.6* 2.6* 2.6*    Cardiac Enzymes Recent Labs  Lab 12/12/17 1353  TROPONINI 0.10*    Glucose Recent Labs  Lab 12/12/17 1914 12/12/17 2321 12/13/17 0345 12/13/17 0738 12/13/17 1137 12/13/17 2028  GLUCAP 141* 136* 151* 192* 237* 281*    Imaging No results found.   STUDIES:  Echo 6/08 >> EF 65 to 70%, grade 1 DD  CULTURES: Blood 6/7 >> negative Sputum 6/7 >> Proteus Blood 6/16 >> Sputum 6/16 >>   ANTIBIOTICS: 6/08 Zosyn>> 6/10 6/08 vancomycin>> 6/10 6/11 Rocephin >> 6/14 Rocephin 6/16 >>   SIGNIFICANT EVENTS: 6/07 Transported to [facility]  6/08 started on dobutamine drip, cardiology consulted 6/15 transition off CRRT 6/16 recurrent  fever 6/18 off levophed (on midodrine and hydrocortisone max doses) 6/19 near arrest with intermittent HD 6/20 family conversation: DNR, no escalation  LINES/TUBES: CVC Left IJ 6/7 (OSH) >>  ETT 6/6 (OSH) >> 6/12 Right IJ Tunneled HD >>  Trach 6/12 >>    DISCUSSION: 72 y/o female with multiple medical problems here with septic shock, chronic respiratory failure requiring mechanical ventilation.  She has ESRD yet is not tolerant of hemodialysis.  Prognosis poor.  Family has elected to not escalate care.   ASSESSMENT / PLAN:  PULMONARY A: Acute respiratory failure with hypoxemia COPD Tracheostomy status Respiratory acidosis P:   Full mechanical vent support VAP prevention Daily WUA/SBT Brovana/pulmicort  CARDIOVASCULAR A:  Refractory  shock> still requiring levophed Hypotension-off pressors 6/21  Afib paroxysmal NSTEMI due to demand ischemia P:  Tele Continue midodrine, hydrocortisone    RENAL A:   ESRD> intolerant of HD Respiratory acidosis P:   No more HD Stop drawing labs  GASTROINTESTINAL A:   No acute issues P: Continue tube feeding Continue medical stress ulcer prophylaxis  HEMATOLOGIC A:   Anemia of chronic disease P:  Monitor for bleeding Transfuse PRBC for Hgb < 7 gm/dL  INFECTIOUS A:   Health care associated pneumonia > proteus DVT prophylaxis P:   Stop ceftriaxone at 14 days  ENDOCRINE A:   DM2   P:   Stop accuchecks and insulin  NEUROLOGIC A:   Acute toxic metabolic encephalopathy Recent strokes (occurred at outside hospital) P:   RASS goal 0 Continue fentanyl infusion  for comfort   FAMILY  - Updates: see discussion from 6/20 note.  The family has elected to not escalate care further. I suspect she will pass over the next 2-3 days.  CODE DNR: -Discussed with palliative medicine .  Tammy Parrett NP-C  [facility]  [phone]    12/15/2017, 10:48 AM

## 2017-12-16 MED ORDER — PROMETHAZINE HCL 25 MG/ML IJ SOLN
12.5000 mg | Freq: Four times a day (QID) | INTRAMUSCULAR | Status: DC | PRN
  Administered 2017-12-17: 12.5 mg via INTRAVENOUS
  Filled 2017-12-16: qty 1

## 2017-12-16 MED ORDER — DEXTROSE 5 % IV SOLN
500.0000 mg | INTRAVENOUS | Status: DC
  Administered 2017-12-16 – 2017-12-18 (×3): 500 mg via INTRAVENOUS
  Filled 2017-12-16 (×4): qty 0.5

## 2017-12-16 MED ORDER — MIDAZOLAM HCL 2 MG/2ML IJ SOLN
2.0000 mg | INTRAMUSCULAR | Status: DC | PRN
  Administered 2017-12-16 – 2017-12-18 (×8): 2 mg via INTRAVENOUS
  Filled 2017-12-16 (×8): qty 2

## 2017-12-16 NOTE — Progress Notes (Signed)
Dr. Kendrick FriesMcquaid aware of patient dropping oxygen saturation consistently. He has ordered us NOT to increase the Fi02 on the ventilator due to patient's DRN/no escalation of care status. On my morning assessment it was noted the Fi02 on the ventilator was increased to 50%. Respiratory made aware and decreased back to 40%. Patient was restless and agitated in the bed on 200 mcg of Fentanyl. Fentanyl gtt increased to 300 mcg. Patient still remained agitated and clearly looked uncomfortable. Fentanyl gtt increased to 400 mcg with 25 mcg bolus. Patient now appears comfortable. RN will continue to monitor without performing wakeup assessment due to patient's status.

## 2017-12-16 NOTE — Progress Notes (Signed)
PULMONARY / CRITICAL CARE MEDICINE   Name: [facility] MRN: [id number] DOB: Dec 17, 1945    ADMISSION DATE:  11/30/2017 CONSULTATION DATE:  11/30/2017  REFERRING MD:  [facility]  CHIEF COMPLAINT:  Respiratory distress  HISTORY OF PRESENT ILLNESS:   72 [facility], [facility], had a GI bleed, had bilateral cortical strokes, later transferred to [facility], intubated there.  Moved to [facility].  Has baseline COPD, Afib, HFpEF, ESRD, CVA, dysphagia.   SUBJECTIVE:  Some hypotension and hypoxemia yesterday Some drainage around PEG noted by nursing  VITAL SIGNS: BP (!) 123/57   Pulse (!) 107   Temp 97.6 F (36.4 C) (Axillary)   Resp (!) 23   Ht 5\' 6"  (1.676 m) Comment: Per [facility] documents  Wt 79.2 kg (174 lb 9.7 oz)   LMP  (LMP Unknown)   SpO2 (!) 88%   BMI 28.18 kg/m   HEMODYNAMICS:    VENTILATOR SETTINGS: Vent Mode: PCV FiO2 (%):  [40 %-50 %] 40 % Set Rate:  [24 bmp] 24 bmp PEEP:  [5 cmH20] 5 cmH20 Plateau Pressure:  [16 cmH20-17 cmH20] 16 cmH20  INTAKE / OUTPUT: I/O last 3 completed shifts: In: 1851.3 [I.V.:1751.3; IV Piggyback:100] Out: -   PHYSICAL EXAMINATION:  General:  In bed on vent HENT: NCAT Tarch in place PULM: CTA B, vent supported breathing CV: RRR, no mgr GI: PEG in place, BS+, soft, nontender MSK: normal bulk and tone Neuro: sedated on vent    LABS:  BMET Recent Labs  Lab 12/10/17 0315 12/11/17 0235 12/12/17 0029  NA 141 140 140  K 3.9 3.7 3.8  CL 104 101 103  CO2 26 23 23   BUN 105* 150* 187*  CREATININE 2.80* 3.50* 4.21*  GLUCOSE 263* 223* 237*    Electrolytes Recent Labs  Lab 12/10/17 0315 12/11/17 0235 12/12/17 0029  CALCIUM 9.7 9.8 9.6  MG 2.5* 2.6*  --   PHOS 3.4 4.4  --     CBC Recent Labs  Lab 12/12/17 0029 12/13/17 0350 12/14/17 0457  WBC 21.0* 26.1* 21.6*  HGB 7.3* 7.7* 7.4*  HCT 24.8* 25.4* 23.9*  PLT 237 289 291     Coag's Recent Labs  Lab 12/13/17 0350 12/14/17 0457 12/15/17 0346  INR 1.43 1.59 1.66    Sepsis Markers No results for input(s): LATICACIDVEN, PROCALCITON, O2SATVEN in the last 168 hours.  ABG Recent Labs  Lab 12/12/17 1013  PHART 7.263*  PCO2ART 52.0*  PO2ART 65.5*    Liver Enzymes Recent Labs  Lab 12/10/17 0315 12/11/17 0235  ALBUMIN 2.6* 2.6*    Cardiac Enzymes Recent Labs  Lab 12/12/17 1353  TROPONINI 0.10*    Glucose Recent Labs  Lab 12/12/17 1914 12/12/17 2321 12/13/17 0345 12/13/17 0738 12/13/17 1137 12/13/17 2028  GLUCAP 141* 136* 151* 192* 237* 281*    Imaging No results found.   STUDIES:  Echo 6/08 >> EF 65 to 70%, grade 1 DD  CULTURES: Blood 6/7 >> negative Sputum 6/7 >> Proteus Blood 6/16 >> Sputum 6/16 >>   ANTIBIOTICS: 6/08 Zosyn>> 6/10 6/08 vancomycin>> 6/10 6/11 Rocephin >> 6/14 Rocephin 6/16 >>   SIGNIFICANT EVENTS: 6/07 Transported to [facility]  6/08 started on dobutamine drip, cardiology consulted 6/15 transition off CRRT 6/16 recurrent fever 6/18 off levophed (on midodrine and hydrocortisone max doses) 6/19 near arrest with intermittent HD 6/20 family conversation: DNR, no escalation  LINES/TUBES: CVC Left IJ 6/7 (OSH) >>  ETT 6/6 (  OSH) >> 6/12 Right IJ Tunneled HD >>  Trach 6/12 >>    DISCUSSION: 72 y/o female with multiple medical problems here with septic shock, chronic respiratory failure requiring mechanical ventilation.  She has ESRD yet is not tolerant of hemodialysis.  Prognosis poor.  Family has elected to not escalate care.   ASSESSMENT / PLAN:  PULMONARY A: Acute respiratory failure with hypoxemia COPD Tracheostomy status Respiratory acidosis P:   Full mechanical vent support VAP prevention Daily WUA/SBT Brovana/pulmicort  CARDIOVASCULAR A:  Refractory shock> multifactorial Afib paroxysmal NSTEMI due to demand ischemia P:  Tele Continue midodrine and  hydrocortisone Not adding back IV vasopressors   RENAL A:   ESRD> intolerant of HD Respiratory acidosis P:   No more HD Not collecting labs  GASTROINTESTINAL A:   Some drainage around PEG P: Continue tube feeding Continue medical stress ulcer prophylaxis Dressing changes q shift around PEG  HEMATOLOGIC A:   Anemia of chronic disease DVT prophylaxis P:  Monitor for bleeding Transfuse PRBC for Hgb < 7 gm/dL  INFECTIOUS A:   Health care associated pneumonia > proteus, pseudomonas P:   Stop cefepime today  ENDOCRINE A:   DM2   P:   Stop accuchecks and insulin  NEUROLOGIC A:   Acute toxic metabolic encephalopathy Recent strokes (occurred at outside hospital) P:   RASS goal 0  Continue fentanyl infusion for comfort   FAMILY  - Updates: see discussion from 6/20 note, we are not escalating care, not performing HD again, not adding back vasopressors  Palliative medicine has been involved.    Heber , MD [facility] Pager: [phone] Cell: [phone] After 3pm or if no response, call [phone]   12/16/2017, 9:06 AM

## 2017-12-17 DIAGNOSIS — J9601 Acute respiratory failure with hypoxia: Secondary | ICD-10-CM

## 2017-12-17 MED ORDER — HYDROMORPHONE HCL 1 MG/ML IJ SOLN
2.0000 mg | INTRAMUSCULAR | Status: DC | PRN
  Administered 2017-12-17 – 2017-12-18 (×5): 2 mg via INTRAVENOUS
  Filled 2017-12-17 (×5): qty 2

## 2017-12-17 NOTE — Progress Notes (Signed)
PULMONARY / CRITICAL CARE MEDICINE   Name: Sheila Cabrera MRN: [id number] DOB: November 29, 1945    ADMISSION DATE:  11/30/2017 CONSULTATION DATE:  11/30/2017  REFERRING MD:  [facility]  CHIEF COMPLAINT:  Respiratory distress  HISTORY OF PRESENT ILLNESS:   71 y/o female presented to [facility] on 10/29/17 with pneumonia, transferred to [facility], had a GI bleed, had bilateral cortical strokes, later transferred to [facility], intubated there.  Moved to [facility].  Has baseline COPD, Afib, HFpEF, ESRD, CVA, dysphagia.   SUBJECTIVE:  Some hypotension and hypoxemia yesterday Some drainage around PEG noted by nursing  VITAL SIGNS: BP (!) 87/45   Pulse 80   Temp 98.7 F (37.1 C) (Axillary)   Resp (!) 24   Ht 5\' 6"  (1.676 m) Comment: Per [facility] documents  Wt 177 lb 0.5 oz (80.3 kg)   LMP  (LMP Unknown)   SpO2 90%   BMI 28.57 kg/m   HEMODYNAMICS:    VENTILATOR SETTINGS: Vent Mode: PCV FiO2 (%):  [40 %] 40 % Set Rate:  [24 bmp] 24 bmp PEEP:  [5 cmH20] 5 cmH20 Plateau Pressure:  [16 cmH20-17 cmH20] 17 cmH20  INTAKE / OUTPUT: I/O last 3 completed shifts: In: 1833 [I.V.:1732.9; IV Piggyback:100.1] Out: -   PHYSICAL EXAMINATION:  General:  In bed on vent HENT: NCAT Tarch in place PULM: CTA B, vent supported breathing CV: RRR, no mgr GI: PEG in place, BS+, soft, nontender MSK: normal bulk and tone Neuro: sedated on vent    LABS:  BMET Recent Labs  Lab 12/11/17 0235 12/12/17 0029  NA 140 140  K 3.7 3.8  CL 101 103  CO2 23 23  BUN 150* 187*  CREATININE 3.50* 4.21*  GLUCOSE 223* 237*    Electrolytes Recent Labs  Lab 12/11/17 0235 12/12/17 0029  CALCIUM 9.8 9.6  MG 2.6*  --   PHOS 4.4  --     CBC Recent Labs  Lab 12/12/17 0029 12/13/17 0350 12/14/17 0457  WBC 21.0* 26.1* 21.6*  HGB 7.3* 7.7* 7.4*  HCT 24.8* 25.4* 23.9*  PLT 237 289 291    Coag's Recent Labs  Lab 12/13/17 0350 12/14/17 0457 12/15/17 0346  INR 1.43 1.59 1.66     Sepsis Markers No results for input(s): LATICACIDVEN, PROCALCITON, O2SATVEN in [facility] last 168 hours.  ABG Recent Labs  Lab 12/12/17 1013  PHART 7.263*  PCO2ART 52.0*  PO2ART 65.5*    Liver Enzymes Recent Labs  Lab 12/11/17 0235  ALBUMIN 2.6*    Cardiac Enzymes Recent Labs  Lab 12/12/17 1353  TROPONINI 0.10*    Glucose Recent Labs  Lab 12/12/17 1914 12/12/17 2321 12/13/17 0345 12/13/17 0738 12/13/17 1137 12/13/17 2028  GLUCAP 141* 136* 151* 192* 237* 281*    Imaging No results found.   STUDIES:  Echo 6/08 >> EF 65 to 70%, grade 1 DD  CULTURES: Blood 6/7 >> negative Sputum 6/7 >> Proteus Blood 6/16 >> Sputum 6/16 >>   ANTIBIOTICS: 6/08 Zosyn>> 6/10 6/08 vancomycin>> 6/10 6/11 Rocephin >> 6/14 Rocephin 6/16 >>   SIGNIFICANT EVENTS: 6/07 Transported to [facility]  6/08 started on dobutamine drip, cardiology consulted 6/15 transition off [facility] 6/16 recurrent fever 6/18 off levophed (on midodrine and hydrocortisone max doses) 6/19 near arrest with intermittent HD 6/20 family conversation: DNR, no escalation  LINES/TUBES: CVC Left IJ 6/7 (OSH) >>  ETT 6/6 (OSH) >> 6/12 Right IJ Tunneled HD >>  Trach 6/12 >>    DISCUSSION: 72 y/o female with  multiple medical problems here with septic shock, chronic respiratory failure requiring mechanical ventilation.  She has ESRD yet is not tolerant of hemodialysis.  Prognosis poor.  Family has elected to not escalate care.   ASSESSMENT / PLAN:  PULMONARY A: Acute respiratory failure with hypoxemia COPD Tracheostomy status Respiratory acidosis P:   Full mechanical vent support VAP prevention Daily WUA/SBT Brovana/pulmicort  CARDIOVASCULAR A:  Refractory shock> multifactorial Afib paroxysmal NSTEMI due to demand ischemia P:  Tele Continue midodrine and hydrocortisone Not adding back IV vasopressors   RENAL A:   ESRD> intolerant of HD Respiratory acidosis P:   No more HD Not  collecting labs  GASTROINTESTINAL A:   Some drainage around PEG P: Continue tube feeding Continue medical stress ulcer prophylaxis Dressing changes q shift around PEG  HEMATOLOGIC A:   Anemia of chronic disease DVT prophylaxis P:  Monitor for bleeding Transfuse PRBC for Hgb < 7 gm/dL  INFECTIOUS A:   Health care associated pneumonia > proteus, pseudomonas P:   Stop cefepime today  ENDOCRINE A:   DM2   P:   Stop accuchecks and insulin  NEUROLOGIC A:   Acute toxic metabolic encephalopathy Recent strokes (occurred at outside [facility]) P:   RASS goal 0  Continue fentanyl infusion for comfort   FAMILY  - Updates: see discussion from 6/20 note, we are not escalating care, not performing HD again, not adding back vasopressors  Palliative medicine has been involved.    [facility]ila [facility]rera Sheila Atha, MD [facility] ICU Physician [facility]  Pager: [phone] Mobile: [phone] After hours: [phone].    12/17/2017, 10:53 PM

## 2017-12-17 NOTE — Progress Notes (Signed)
MEDICATION RELATED NOTE    Pharmacy Re:  Home meds  Assessment: 72yo female admitted from [facility].  We have reviewed her prior to admit medications and have entered those meds that she was receiving at [facility].  We have no information on what medications she was taking prior to this.  Plan:  I have marked her med-hx. complete.  Please review and order those you feel most appropriate now and at discharge.  Pharmacy will be happy to update history should new information be obtained.  Nadara MustardNita Sherby Moncayo, PharmD., MS Clinical Pharmacist Pager:  [phone] Thank you for allowing pharmacy to be part of this patients care team. 12/17/2017,11:02 AM

## 2017-12-18 ENCOUNTER — Encounter (HOSPITAL_COMMUNITY): Payer: Self-pay

## 2017-12-18 DIAGNOSIS — N170 Acute kidney failure with tubular necrosis: Secondary | ICD-10-CM

## 2017-12-18 MED ORDER — LORAZEPAM 2 MG/ML IJ SOLN
INTRAMUSCULAR | Status: AC
  Filled 2017-12-18: qty 1

## 2017-12-18 MED ORDER — LORAZEPAM 2 MG/ML IJ SOLN
2.0000 mg | INTRAMUSCULAR | Status: DC | PRN
  Administered 2017-12-19 – 2017-12-20 (×2): 2 mg via INTRAVENOUS
  Filled 2017-12-18 (×2): qty 1

## 2017-12-18 MED ORDER — SODIUM CHLORIDE 0.9 % IV SOLN
5.0000 mg/h | INTRAVENOUS | Status: DC
  Administered 2017-12-18: 5 mg/h via INTRAVENOUS
  Administered 2017-12-18: 3 mg/h via INTRAVENOUS
  Administered 2017-12-19: 6 mg/h via INTRAVENOUS
  Administered 2017-12-19: 5 mg/h via INTRAVENOUS
  Administered 2017-12-19: 6 mg/h via INTRAVENOUS
  Administered 2017-12-20 (×3): 7 mg/h via INTRAVENOUS
  Filled 2017-12-18 (×7): qty 5

## 2017-12-18 MED ORDER — HYDROCORTISONE NA SUCCINATE PF 100 MG IJ SOLR: 25.0000 mg | Freq: Two times a day (BID) | INTRAMUSCULAR | Status: DC

## 2017-12-18 MED ORDER — HYDROCORTISONE NA SUCCINATE PF 100 MG IJ SOLR: 50.0000 mg | Freq: Two times a day (BID) | INTRAMUSCULAR | Status: DC

## 2017-12-18 MED ORDER — LORAZEPAM 2 MG/ML IJ SOLN
1.0000 mg | Freq: Once | INTRAMUSCULAR | Status: AC
  Administered 2017-12-18: 1 mg via INTRAVENOUS

## 2017-12-18 MED ORDER — DEXTROSE 5 % IV SOLN: 500.0000 mg | INTRAVENOUS | Status: DC

## 2017-12-18 MED ORDER — HYDROMORPHONE HCL 1 MG/ML IJ SOLN
1.0000 mg | INTRAMUSCULAR | Status: DC | PRN
  Administered 2017-12-18 – 2017-12-19 (×5): 2 mg via INTRAVENOUS

## 2017-12-18 NOTE — Progress Notes (Signed)
Wasted 25 mcg of Fentanyl in sink with Gwenevere Ghaziillon White RN.

## 2017-12-18 NOTE — Progress Notes (Signed)
PULMONARY / CRITICAL CARE MEDICINE   Name: Sheila Cabrera MRN: [id number] DOB: January 03, 1946    ADMISSION DATE:  11/30/2017 CONSULTATION DATE:  11/30/2017  REFERRING MD:  [facility]  CHIEF COMPLAINT:  Respiratory distress  HISTORY OF PRESENT ILLNESS:   72 y/o female presented to [facility] on 10/29/17 with pneumonia, transferred to [facility], had a GI bleed, had bilateral cortical strokes, later transferred to [facility], intubated there.  Moved to [facility] for CRRT.  Has baseline COPD, Afib, HFpEF, ESRD, CVA, dysphagia.   SUBJECTIVE:  Was maxed on Fentanyl changed to dilaudid gtt now at 5 mg/hr as patient showing signs of discomfort/ grimacing, episodes of shaking, PO meds stopped.   PMT spoke with son who is still not ready to transition to full comfort care but ready to allow natural progression.   Since, progressive hypoxia and hypotension.    VITAL SIGNS: BP (!) 93/46   Pulse 87   Temp 98.6 F (37 C) (Oral)   Resp (!) 24   Ht 5\' 6"  (1.676 m) Comment: [facility]  Wt 179 lb 14.3 oz (81.6 kg)   LMP  (LMP Unknown)   SpO2 91%   BMI 29.04 kg/m   HEMODYNAMICS:    VENTILATOR SETTINGS: Vent Mode: PCV FiO2 (%):  [40 %] 40 % Set Rate:  [24 bmp] 24 bmp PEEP:  [5 cmH20] 5 cmH20 Plateau Pressure:  [16 cmH20-19 cmH20] 17 cmH20  INTAKE / OUTPUT: I/O last 3 completed shifts: In: 1802.3 [I.V.:1752.3; IV Piggyback:50] Out: 0   PHYSICAL EXAMINATION: General:  Chronically ill female lying in bed in distress, grimacing, pulling arms up HEENT: midline trach Neuro: does not follow commands, moving spont CV: SR PULM: synchronous on MV, decreasing MV on PSV, and comfort care GI: PEG clamped, no bs Extremities: atrophy Skin: w/d  LABS:  BMET Recent Labs  Lab 12/12/17 0029  NA 140  K 3.8  CL 103  CO2 23  BUN 187*  CREATININE 4.21*  GLUCOSE 237*    Electrolytes Recent Labs  Lab 12/12/17 0029  CALCIUM 9.6    CBC Recent Labs  Lab 12/12/17 0029  12/13/17 0350 12/14/17 0457  WBC 21.0* 26.1* 21.6*  HGB 7.3* 7.7* 7.4*  HCT 24.8* 25.4* 23.9*  PLT 237 289 291    Coag's Recent Labs  Lab 12/13/17 0350 12/14/17 0457 12/15/17 0346  INR 1.43 1.59 1.66    Sepsis Markers No results for input(s): LATICACIDVEN, PROCALCITON, O2SATVEN in the last 168 hours.  ABG Recent Labs  Lab 12/12/17 1013  PHART 7.263*  PCO2ART 52.0*  PO2ART 65.5*    Liver Enzymes No results for input(s): AST, ALT, ALKPHOS, BILITOT, ALBUMIN in the last 168 hours.  Cardiac Enzymes Recent Labs  Lab 12/12/17 1353  TROPONINI 0.10*    Glucose Recent Labs  Lab 12/12/17 1914 12/12/17 2321 12/13/17 0345 12/13/17 0738 12/13/17 1137 12/13/17 2028  GLUCAP 141* 136* 151* 192* 237* 281*    Imaging No results found.   STUDIES:  Echo 6/08 >> EF 65 to 70%, grade 1 DD  CULTURES: Blood 6/7 >> negative Sputum 6/7 >> Proteus Blood 6/16 >> Sputum 6/16 >>   ANTIBIOTICS: 6/08 Zosyn>> 6/10 6/08 vancomycin>> 6/10 6/11 Rocephin >> 6/14 Rocephin 6/16 >> 6/18 Cefepime 6/23 >>6/25  SIGNIFICANT EVENTS: 6/07 Transported to [facility]  6/08 started on dobutamine drip, cardiology consulted 6/15 transition off CRRT 6/16 recurrent fever 6/18 off levophed (on midodrine and hydrocortisone max doses) 6/19 near arrest with intermittent HD 6/20 family conversation: DNR,  no escalation 6/  LINES/TUBES: CVC Left IJ 6/7 (OSH) >>  ETT 6/6 (OSH) >> 6/12 Right IJ Tunneled HD >>  Trach 6/12 >>  DISCUSSION: 72 y/o female with multiple medical problems here with septic shock, chronic respiratory failure requiring mechanical ventilation.  She has ESRD yet is not tolerant of hemodialysis.  Prognosis poor.  Family has elected to not escalate care.   ASSESSMENT / PLAN:  PULMONARY A: Acute respiratory failure with hypoxemia COPD Tracheostomy status Respiratory acidosis P:   Continue MV- PSV, decreasing MV with comfort care- will not change mode as this  would be escalation of current care.  Continue supportive/ comfort care without discontinuation of MV at this time VAP prevention Brovana/pulmicort  CARDIOVASCULAR A:  Refractory shock> multifactorial Afib paroxysmal NSTEMI due to demand ischemia P:  Tele monitor DNR/ comfort care- no escalation of care  D/c solucortef and midodrine DNR/    RENAL A:   ESRD> intolerant of HD Respiratory acidosis P:   iHD attempted 6/19 with near arrest.  Not CRRT candidate.   Comfort measures  No labs  GASTROINTESTINAL A:   Some dark drainage around PEG 6/24 TF stopped on 6/20 2/2 vomiting P: NPO, not using PEG as considered source of discomfort for her D/c SUP  HEMATOLOGIC A:   Anemia of chronic disease DVT prophylaxis P:  Supportive care SCDs  INFECTIOUS A:   Health care associated pneumonia > proteus, pseudomonas P:   D/c abx   ENDOCRINE A:   DM2   P:   No CBGs  NEUROLOGIC A:   Acute toxic metabolic encephalopathy Recent strokes (occurred at outside hospital) P:   Appreciate PMT assistance Comfort /supportive care, changed to dilaudid gtt per PMT, for comfort as patient was at max and in obvious discomfort Add Ativan PRN agitation, anxiety, SOB Robinul prn    FAMILY  - Updates: Spoke with Clerance LavMarianne with PMT who has been in contact with patient's son who is aware of patient's pain and wishes for her to be comfort, but no liberation from vent.  Is aware that patient may not survive the day, however is at work.  We are to call if/ when she passes.   Appreciate all of PMT's assistance.   Posey BoyerBrooke Simpson, AGACNP-BC [facility] Pgr: [phone] or if no answer [phone] 12/18/2017, 3:10 PM

## 2017-12-18 NOTE — Progress Notes (Signed)
Brief Nutrition Note:   Palliative care notes reviewed. Pt is not comfort care; pt remains on vent support but no escalation in care, dialysis has been stopped, vasopressors stopped, no labs etc.Noted palliative care team to meet with family again tomorrow.    Noted pt had been vomiting anytime anything was put through feeding tube. Per MD notes, nothing to be given via PEG tube due to vomiting and poc. TF on hold, no meds or water via TF  No further interventions warranted at this time Please re-consult RD if needed  Romelle StarcherCate Nyashia Raney MS, RD, LDN, CNSC [phone] Pager  [phone] Weekend/On-Call Pager

## 2017-12-18 NOTE — Progress Notes (Addendum)
Daily Progress Note   Patient Name: Sheila Cabrera       Date: 12/18/2017 DOB: 03/22/1946  Age: 72 y.o. MRN#: [id number] Attending Physician: Kipp Brood, MD Primary Care Physician: Vincente Liberty, MD Admit Date: 11/30/2017  Reason for Consultation/Follow-up: Establishing goals of care  Subjective:  Examined patient and spoke with bedside RN.  Then spoke with both Larkin Ina and Albina Billet.  Explained that their mother was in pain (grimacing, tremoring, tachycardic) and had been vomiting when the PEG tube was used.  Larkin Ina expressed concern.  He does not want his mother in pain and he does not want her vomiting.  They agreed to rotate the pain medications to improve pain control and stop using the PEG tube.   I explained that she is so fragile that these changes may cause her BP to drop and she may pass away.   Both Children were agreeable to the changes.  Larkin Ina commented that he felt his mother was too unstable to be transferred out of the hospital.  He stated last week when we stopped hemodialysis we agreed to let nature take it's course.  Assessment:   On vent.  Hemodialysis stopped.  Patient appears uncomfortable.   Patient Profile/HPI:  72 y.o.femalewith past medical history of CVA, PAF, GIB, essential hypertension, DM, COPD, CKD, and HFpEFadmitted on6/12/2017. Patient presented to [facility] on 10/29/17 with hypoxia/AMS secondary to pneumonia requiring intubation. Transferred to [facility]. Eliquis was held due to GIB. During this time, patient developed bilateral cortical strokes. With previous history of CKD stage 4 which progressed to ESRD requiring hemodialysis. Eventually extubated using BiPAP and transferred to [facility] due to inability to wean from BiPAP. On 11/30/17  patient intubated due to respiratory distress and was transferred to [facility] with bradycardia and hypotension during dialysis. Patient found to be in septic shock secondary to health care associated pneumonia (proteus) receiving IV antibiotics. Tracheostomy placed on 6/12 and remains on full mechanical ventilation support. ESRD requiring CRRT for several days. IHD attempted on 6/19 and patient experienced a near-arrest event. Nephrology following. Risks of IHD outweigh benefits and will no longer offer this intervention. Not a candidate for further CRRT due to no definable end point. Palliative medicine consultation for goals of care in patient with poor prognosis.  Length of Stay: 18  Current Medications: Scheduled  Meds:  . alteplase  2 mg Intracatheter Once  . alteplase  2 mg Intracatheter Once  . arformoterol  15 mcg Nebulization BID  . budesonide (PULMICORT) nebulizer solution  0.5 mg Nebulization BID  . ceFEPime (MAXIPIME) IV  500 mg Intravenous Q24H  . chlorhexidine gluconate (MEDLINE KIT)  15 mL Mouth Rinse BID  . Chlorhexidine Gluconate Cloth  6 each Topical Daily  . collagenase   Topical Daily  . darbepoetin (ARANESP) injection - DIALYSIS  100 mcg Intravenous Q Wed-HD  . mouth rinse  15 mL Mouth Rinse 10 times per day  . pantoprazole sodium  40 mg Per Tube Daily    Continuous Infusions: . sodium chloride 10 mL/hr at 12/18/17 1000  . sodium chloride 10 mL/hr at 12/11/17 1152  . sodium chloride    . sodium chloride    . sodium chloride    . HYDROmorphone 5 mg/hr (12/18/17 1114)    PRN Meds: sodium chloride, sodium chloride, sodium chloride, sodium chloride, sodium chloride, acetaminophen (TYLENOL) oral liquid 160 mg/5 mL, alteplase, docusate, glycopyrrolate, HYDROmorphone (DILAUDID) injection, ipratropium-albuterol, lidocaine (PF), lidocaine-prilocaine, magnesium hydroxide, metoprolol tartrate, midazolam, pentafluoroprop-tetrafluoroeth, promethazine  Physical Exam    Elderly female, Eyes clinched shut, left arm tremor, face grimacing Tachycardic Abdomen soft  Vital Signs: BP (!) 96/48   Pulse 93   Temp 98.6 F (37 C) (Oral)   Resp (!) 24   Ht _0  (1.676 m) Comment: Per [facility] documents  Wt 81.6 kg (179 lb 14.3 oz)   LMP  (LMP Unknown)   SpO2 91%   BMI 29.04 kg/m  SpO2: SpO2: 91 % O2 Device: O2 Device: Ventilator O2 Flow Rate:    Intake/output summary:   Intake/Output Summary (Last 24 hours) at 12/18/2017 1238 Last data filed at 12/18/2017 1000 Gross per 24 hour  Intake 1073.82 ml  Output 0 ml  Net 1073.82 ml   LBM: Last BM Date: 12/17/17 Baseline Weight: Weight: 87.6 kg (193 lb 2 oz) Most recent weight: Weight: 81.6 kg (179 lb 14.3 oz)       Palliative Assessment/Data: 10%    Flowsheet Rows     Most Recent Value  Intake Tab  Referral Department  Critical care  Unit at Time of Referral  ICU  Palliative Care Primary Diagnosis  Nephrology  Palliative Care Type  New Palliative care  Reason for referral  Clarify Goals of Care  Date first seen by Palliative Care  12/12/17  Clinical Assessment  Palliative Performance Scale Score  30%  Psychosocial & Spiritual Assessment  Palliative Care Outcomes  Patient/Family meeting held?  Yes  Who was at the meeting?  son  Palliative Care Outcomes  Clarified goals of care, Provided psychosocial or spiritual support, Provided end of life care assistance, Changed CPR status, Improved pain interventions, Improved non-pain symptom therapy      Patient Active Problem List   Diagnosis Date Noted  . Palliative care by specialist   . Terminal care   . Ventilator dependent ([place])   . Anxiety state   . Acute renal failure superimposed on stage 4 chronic kidney disease ([place]) 11/30/2017  . Pressure injury of skin 11/30/2017  . Acute and chronic respiratory failure with hypoxia ([place]) 11/30/2017  . COPD with emphysema ([place]) 11/30/2017  . Anemia of chronic disease 11/30/2017  . Pulmonary  edema 11/30/2017    Palliative Care Plan    Recommendations/Plan:  Will rotated gtt from fentanyl to dilaudid.  Worked with pharmacy on dosing but it may  need to be titrated up.  Discontinued oral medications/nutrition thru the tube as the patient is vomiting  Per son - allow nature to take its course and ensure comfort.  PMT will plan to meet with family tomorrow if patient survives.   To discuss withdraw of the vent.   Goals of Care and Additional Recommendations:  Limitations on Scope of Treatment: Full Comfort Care.  We are not withdrawing vent support at this time.  Code Status:  DNR  Prognosis:   Hours - Days   Discharge Planning:  Anticipated Hospital Death  Care plan was discussed with RN, CCM MD, son and daughter  Thank you for allowing the Palliative Medicine Team to assist in the care of this patient.  Total time spent:  35 min.     Greater than 50%  of this time was spent counseling and coordinating care related to the above assessment and plan.  Florentina Jenny, PA-C Palliative Medicine  Please contact Palliative MedicineTeam phone at [phone] for questions and concerns between 7 am - 7 pm.   Please see AMION for individual provider pager numbers.

## 2017-12-19 DIAGNOSIS — Z992 Dependence on renal dialysis: Secondary | ICD-10-CM

## 2017-12-19 DIAGNOSIS — N186 End stage renal disease: Secondary | ICD-10-CM

## 2017-12-19 DIAGNOSIS — Z930 Tracheostomy status: Secondary | ICD-10-CM

## 2017-12-19 NOTE — Progress Notes (Addendum)
Daily Progress Note   Patient Name: Sheila Cabrera       Date: 12/19/2017 DOB: [birth year]  Age: 72 y.o. MRN#: [id number] Attending Physician: Kipp Brood, MD Primary Care Physician: Vincente Liberty, MD Admit Date: 11/30/2017  Reason for Consultation/Follow-up: Establishing goals of care, Non pain symptom management, Psychosocial/spiritual support and Terminal Care  Subjective: Patient appears much more comfortable this morning.  No further grimacing or tremors.   Will call Larkin Ina later this morning after he wakes up and talk with him.  His mother is now comfortable, but no longer able to take nutrition (as she vomits).  Midodrine and hydrocortisone discontinued as they appeared to be making her more agitated.  VSS (actually improved).  No further labs or diagnostics. Her medications are completely geared to make her comfortable.   Assessment: 15 yof ESRD no longer receiving HD (intolerant); NSTEMI, acute hypoxic respiratory failure now vent dependent.  Patient is resting comfortably.   Patient Profile/HPI:  72 y.o. female  with past medical history of CVA, PAF, GIB, essential hypertension, DM, COPD, CKD, and HFpEF admitted on 11/30/2017. Patient presented to [facility] on 10/29/17 with hypoxia/AMS secondary to pneumonia requiring intubation. Transferred to [facility]. Eliquis was held due to GIB. During this time, patient developed bilateral cortical strokes. With previous history of CKD stage 4 which progressed to ESRD requiring hemodialysis. Eventually extubated using BiPAP and transferred to [facility] due to inability to wean from BiPAP. On 11/30/17 patient intubated due to respiratory distress and was transferred to [facility] with bradycardia and hypotension during dialysis.  Patient found to be in septic shock secondary to health care associated pneumonia (proteus) receiving IV antibiotics. Tracheostomy placed on 6/12 and remains on full mechanical ventilation support. ESRD requiring CRRT for several days. IHD attempted on 6/19 and patient experienced a near-arrest event. Nephrology following. Risks of IHD outweigh benefits and will no longer offer this intervention. Not a candidate for further CRRT due to no definable end point. Palliative medicine consultation for goals of care in patient with poor prognosis.    Length of Stay: 19  Current Medications: Scheduled Meds:  . alteplase  2 mg Intracatheter Once  . alteplase  2 mg Intracatheter Once  . chlorhexidine gluconate (MEDLINE KIT)  15 mL Mouth Rinse BID  . Chlorhexidine Gluconate Cloth  6 each  Topical Daily  . collagenase   Topical Daily  . mouth rinse  15 mL Mouth Rinse 10 times per day    Continuous Infusions: . sodium chloride Stopped (12/18/17 1612)  . sodium chloride 10 mL/hr at 12/11/17 1152  . sodium chloride    . sodium chloride    . sodium chloride    . HYDROmorphone 5 mg/hr (12/19/17 0800)    PRN Meds: sodium chloride, sodium chloride, sodium chloride, sodium chloride, sodium chloride, acetaminophen (TYLENOL) oral liquid 160 mg/5 mL, alteplase, docusate, glycopyrrolate, HYDROmorphone (DILAUDID) injection, ipratropium-albuterol, lidocaine (PF), lidocaine-prilocaine, LORazepam, magnesium hydroxide, pentafluoroprop-tetrafluoroeth, promethazine  Physical Exam        Well developed female.  Appears in NAD, chronically ill, sleeping and does not wake to exam. CV rrr resp no distress Abdomen soft.  Vital Signs: BP 125/79 (BP Location: Right Arm)   Pulse 98   Temp 98 F (36.7 C)   Resp (!) 21   Ht _0  (1.676 m) Comment: Per [facility] documents  Wt 81.5 kg (179 lb 10.8 oz)   LMP  (LMP Unknown)   SpO2 90%   BMI 29.00 kg/m  SpO2: SpO2: 90 % O2 Device: O2 Device: Ventilator O2  Flow Rate:    Intake/output summary:   Intake/Output Summary (Last 24 hours) at 12/19/2017 3329 Last data filed at 12/19/2017 0800 Gross per 24 hour  Intake 479.25 ml  Output 0 ml  Net 479.25 ml   LBM: Last BM Date: 12/17/17 Baseline Weight: Weight: 87.6 kg (193 lb 2 oz) Most recent weight: Weight: 81.5 kg (179 lb 10.8 oz)       Palliative Assessment/Data: 10%    Flowsheet Rows     Most Recent Value  Intake Tab  Referral Department  Critical care  Unit at Time of Referral  ICU  Palliative Care Primary Diagnosis  Nephrology  Date Notified  12/11/17  Palliative Care Type  New Palliative care  Reason for referral  Clarify Goals of Care  Date of Admission  11/30/17  Date first seen by Palliative Care  12/12/17  # of days Palliative referral response time  1 Day(s)  # of days IP prior to Palliative referral  11  Clinical Assessment  Palliative Performance Scale Score  30%  Psychosocial & Spiritual Assessment  Palliative Care Outcomes  Patient/Family meeting held?  Yes  Who was at the meeting?  son  Palliative Care Outcomes  Clarified goals of care, Provided psychosocial or spiritual support, Provided end of life care assistance, Changed CPR status, Improved pain interventions, Improved non-pain symptom therapy      Patient Active Problem List   Diagnosis Date Noted  . Palliative care by specialist   . Terminal care   . Ventilator dependent ([place])   . Anxiety state   . Acute renal failure superimposed on stage 4 chronic kidney disease ([place]) 11/30/2017  . Pressure injury of skin 11/30/2017  . Acute and chronic respiratory failure with hypoxia ([place]) 11/30/2017  . COPD with emphysema ([place]) 11/30/2017  . Anemia of chronic disease 11/30/2017  . Pulmonary edema 11/30/2017    Palliative Care Plan    Recommendations/Plan:  Goal is comfort.  No escalation of care.  Family is still hopeful but understands she is dying.  Please utilize PRNs if patient shows any signs of  distress.  Goals of Care and Additional Recommendations:  Limitations on Scope of Treatment: Full Comfort Care  Code Status:  DNR  Prognosis:   Days to possible 1 week+  Discharge Planning:  Anticipated Hospital Death VS return to [place] was discussed with bedside RN and CCM MD  Thank you for allowing the Palliative Medicine Team to assist in the care of this patient.  Total time spent:  25 min.     Greater than 50%  of this time was spent counseling and coordinating care related to the above assessment and plan.  Florentina Jenny, PA-C Palliative Medicine  Please contact Palliative MedicineTeam phone at [phone] for questions and concerns between 7 am - 7 pm.   Please see AMION for individual provider pager numbers.

## 2017-12-20 ENCOUNTER — Encounter (HOSPITAL_COMMUNITY): Payer: Self-pay | Admitting: *Deleted

## 2017-12-20 MED ORDER — HYDROMORPHONE HCL 1 MG/ML IJ SOLN: 1.0000 mg | INTRAMUSCULAR | 0 refills | Status: AC | PRN

## 2017-12-20 MED ORDER — GLYCOPYRROLATE 0.2 MG/ML IJ SOLN: 0.2000 mg | INTRAMUSCULAR | Status: AC | PRN

## 2017-12-20 MED ORDER — PROMETHAZINE HCL 25 MG/ML IJ SOLN: 12.5000 mg | Freq: Four times a day (QID) | INTRAMUSCULAR | 0 refills | Status: AC | PRN

## 2017-12-20 MED ORDER — SODIUM CHLORIDE 0.9 % IV SOLN: 5.0000 mg/h | INTRAVENOUS | Status: AC

## 2017-12-20 MED ORDER — LORAZEPAM 2 MG/ML IJ SOLN: 2.0000 mg | INTRAMUSCULAR | 0 refills | Status: AC | PRN

## 2017-12-20 NOTE — Progress Notes (Addendum)
Spoke with CCM MD.  Patient to discharge back to [facility] for comfort care.  DC summary complete.  I made a phone call to Jill AlexandersJustin (son) and a phone call to Grover CanavanKrystal (daughter).   I explained that their mother was going back to [facility] for comfort care while she remained on the vent.  That she would likely remain asleep on the vent until she passed away.  She is comfortable now.  She is unable to tolerate dialysis (she had a near arrest event on HD on 6/19) and she is unable to accept nutrition even via an N/G tube (she vomits).   We are uncertain how long she will remain on comfort care but will likely pass away in some number of days in her sleep.   Jill AlexandersJustin and Winter GardenKrystal both understood and were appreciative of the phone call.  Norvel RichardsMarianne Phala Schraeder, PA-C Palliative Medicine Pager: [phone]  15 min.

## 2017-12-20 NOTE — Care Management Note (Addendum)
Case Management Note  Patient Details  Name: Sheila Cabrera MRN: [id number] Date of Birth: 20-Jun-1946  Subjective/Objective: Plan for  Patient to dc to [facility] today, NCM spoke with son and gave him this information, he states he spoke with [facility] with Palliative also. Waiting to hear back from WarrenEmily , [facility] rep,.                Action/Plan: [facility] today.  Expected Discharge Date:  12/20/17               Expected Discharge Plan:  Long Term Acute Care (LTAC)(from [facility])  In-House Referral:     Discharge planning Services  CM Consult  Post Acute Care Choice:  Long Term Acute Care (LTAC) Choice offered to:     DME Arranged:  N/A DME Agency:  NA  HH Arranged:  NA HH Agency:  NA  Status of Service:  Completed, signed off  If discussed at Long Length of Stay Meetings, dates discussed:    Additional Comments:  Leone Havenaylor, Isatu Macinnes [place], RN 12/20/2017, 1:53 PM

## 2017-12-20 NOTE — Discharge Summary (Addendum)
Physician Discharge Summary       Patient ID: Sheila Cabrera MRN: [id number] DOB/AGE: August 30, 1945 72 y.o.  Admit date: 11/30/2017 Discharge date: 12/20/2017  Discharge Diagnoses:  Principal Problem:   Acute and chronic respiratory failure with hypoxia (HCC) Active Problems:   Acute renal failure superimposed on stage 4 chronic kidney disease (HCC)   Pressure injury of skin   COPD with emphysema (HCC)   Anemia of chronic disease   Pulmonary edema   Palliative care by specialist   Terminal care   Ventilator dependent [facility])   Anxiety state   Tracheostomy status (HCC)   ESRD on dialysis (HCC)   History of Present Illness:  72 y.o.femalewith past medical history of CVA, PAF, GIB, essential hypertension, DM, COPD, CKD, and HFpEFadmitted on6/12/2017. Patient presented to [facility] on 10/29/17 with hypoxia/AMS secondary to pneumonia requiring intubation. Transferred to [facility]. Eliquis was held due to GIB. During this time, patient developed bilateral cortical strokes. With previous history of CKD stage 4 which progressed to ESRD requiring hemodialysis. Eventually extubated using BiPAP and transferred to [facility] due to inability to wean from BiPAP.   Hospital Course:   On 11/30/17 patient intubated due to respiratory distress and was transferred to [facility] with bradycardia and hypotension during dialysis. Patient found to be in septic shock secondary to health care associated pneumonia (proteus) receiving IV antibiotics. Tracheostomy placed on 6/12 and remains on full mechanical ventilation support. ESRD requiring CRRT for several days. IHD attempted on 6/19 and patient experienced a near-arrest event. Nephrology following. Risks of IHD outweigh benefits and will no longer offer this intervention. Not a candidate for further CRRT due to no definable end point. She was transitioned to comfort care with no escalation and provided PRN pain control. She was deemed a  candidate for transfer to [facility] for comfort care treatment. Indeterminate prognosis as it pertains to end of life.      Discharge Plan by active problems   Acute hypoxemic respiratory failure secondary to HCAP and COPD. Tracheostomy status. - Comfort based care while maintaining mechanical ventilation: PCV:15, rate 24, PEEP 5, 40% - Dilaudid infusion titrated for comfort. PRN ativan - Nebulized steroids/bronchodilators scheduled.  - Tracheostomy care  ESRD - unable to tolerate HD, nearly arrested 6/19. - No further HD treatments  Health care associated pneumonia > resolved  CVA - transition to comfort care   Significant Hospital tests/ studies  STUDIES: Echo 6/08 >> EF 65 to 70%, grade 1 DD  CULTURES: Blood 6/7 >> negative Sputum 6/7 >> Proteus Blood 6/16 >> neg Sputum 6/16 >>pseudomonas pan sens  ANTIBIOTICS: 6/08 Zosyn>> 6/10 6/08 vancomycin>> 6/10 6/11 Rocephin >> 6/14 Rocephin 6/16 >>6/18 Cefepime 6/23 >>6/25  SIGNIFICANT EVENTS: 6/07 Transported to [facility]  6/08 started on dobutamine drip, cardiology consulted 6/15 transition off CRRT 6/16 recurrent fever 6/18 off levophed (on midodrine and hydrocortisone max doses) 6/19 near arrest with intermittent HD 6/20 family conversation: DNR, no escalation   LINES/TUBES: CVC Left IJ 6/7 (OSH) >> ETT 6/6 (OSH) >> 6/12 Right IJ Tunneled HD >> Trach 6/12 >>   Consults  Palliative medicine Wound care General surgery Cardiology Nephrology  Discharge Exam: BP (!) 95/45   Pulse 82   Temp (!) 97.5 F (36.4 C) (Oral)   Resp (!) 24   Ht 5\' 6"  (1.676 m) Comment: Per [facility] documents  Wt 81.5 kg (179 lb 10.8 oz)   LMP  (LMP Unknown)   SpO2 96%   BMI 29.00  kg/m   General:  Chronically ill female lying in bed in distress, grimacing, pulling arms up HEENT: midline trach Neuro: does not follow commands, moving spont CV: SR PULM: synchronous on MV, decreasing MV on PSV, and comfort  care GI: PEG clamped, no bs Extremities: atrophy Skin: w/d  Labs at discharge Lab Results  Component Value Date   CREATININE 4.21 (H) 12/12/2017   BUN 187 (H) 12/12/2017   NA 140 12/12/2017   K 3.8 12/12/2017   CL 103 12/12/2017   CO2 23 12/12/2017   Lab Results  Component Value Date   WBC 21.6 (H) 12/14/2017   HGB 7.4 (L) 12/14/2017   HCT 23.9 (L) 12/14/2017   MCV 91.2 12/14/2017   PLT 291 12/14/2017   Lab Results  Component Value Date   ALT 16 12/06/2017   AST 29 12/06/2017   ALKPHOS 94 12/06/2017   BILITOT 0.6 12/06/2017   Lab Results  Component Value Date   INR 1.66 12/15/2017   INR 1.59 12/14/2017   INR 1.43 12/13/2017    Current radiology studies No results found.  Disposition:  Discharge disposition: [facility] Institution Not Defined        Allergies as of 12/20/2017   No Known Allergies     Medication List    STOP taking these medications   acetaminophen 325 MG tablet Commonly known as:  TYLENOL   atorvastatin 80 MG tablet Commonly known as:  LIPITOR   b complex-vitamin c-folic acid 0.8 MG Tabs tablet   [employer] PERIANAL CLEANSING EX   Darbepoetin Alfa 60 MCG/0.3ML Sosy injection Commonly known as:  ARANESP   escitalopram 20 MG tablet Commonly known as:  LEXAPRO   feeding supplement (OSMOLITE 1.5 CAL) Liqd   feeding supplement (PRO-STAT SUGAR FREE 64) Liqd   ferric gluconate 125 mg in sodium chloride 0.9 % 100 mL   folic acid 1 MG tablet Commonly known as:  FOLVITE   insulin detemir 100 UNIT/ML injection Commonly known as:  LEVEMIR   insulin regular 100 units/mL injection Commonly known as:  NOVOLIN R,HUMULIN R   ipratropium-albuterol 0.5-2.5 (3) MG/3ML Soln Commonly known as:  DUONEB   lansoprazole 15 MG capsule Commonly known as:  PREVACID   metoprolol tartrate 25 MG tablet Commonly known as:  LOPRESSOR   midodrine 10 MG tablet Commonly known as:  PROAMATINE   QUEtiapine 25 MG tablet Commonly known  as:  SEROQUEL   thiamine 100 MG tablet   traZODone 50 MG tablet Commonly known as:  DESYREL   vitamin B-12 100 MCG tablet Commonly known as:  CYANOCOBALAMIN   warfarin 6 MG tablet Commonly known as:  COUMADIN     TAKE these medications   glycopyrrolate 0.2 MG/ML injection Commonly known as:  ROBINUL Inject 1 mL (0.2 mg total) into the vein every 4 (four) hours as needed (secretions).   HYDROmorphone 1 MG/ML injection Commonly known as:  DILAUDID Inject 1-2 mLs (1-2 mg total) into the vein every 2 (two) hours as needed for moderate pain or severe pain.   HYDROmorphone 50 mg in sodium chloride 0.9 % 95 mL Inject 5-7 mg/hr into the vein continuous.   LORazepam 2 MG/ML injection Commonly known as:  ATIVAN Inject 1-2.5 mLs (2-5 mg total) into the vein every 15 (fifteen) minutes as needed for anxiety (discomfort, SOB, or agitation).   promethazine 25 MG/ML injection Commonly known as:  PHENERGAN Inject 0.5 mLs (12.5 mg total) into the vein every 6 (six) hours as needed for  nausea or vomiting.       Discharged Condition: poor  38 minutes of time have been dedicated to discharge assessment, planning and discharge instructions.   Signed: Joneen RoachPaul Lashana Spang, AGACNP-BC [facility] Pager [phone] or [phone]  12/20/2017 11:06 AM

## 2017-12-24 MED FILL — Medication: Qty: 1 | Status: AC

## 2018-01-24 DEATH — deceased

## 2019-09-13 IMAGING — DX DG CHEST 1V PORT
1 series · 1 of 1 positions shown · non-contrast
Comparison: Chest radiograph performed earlier today at [DATE] p.m.

CLINICAL DATA: Endotracheal tube repositioning.

EXAM:
PORTABLE CHEST 1 VIEW

[chest ap]
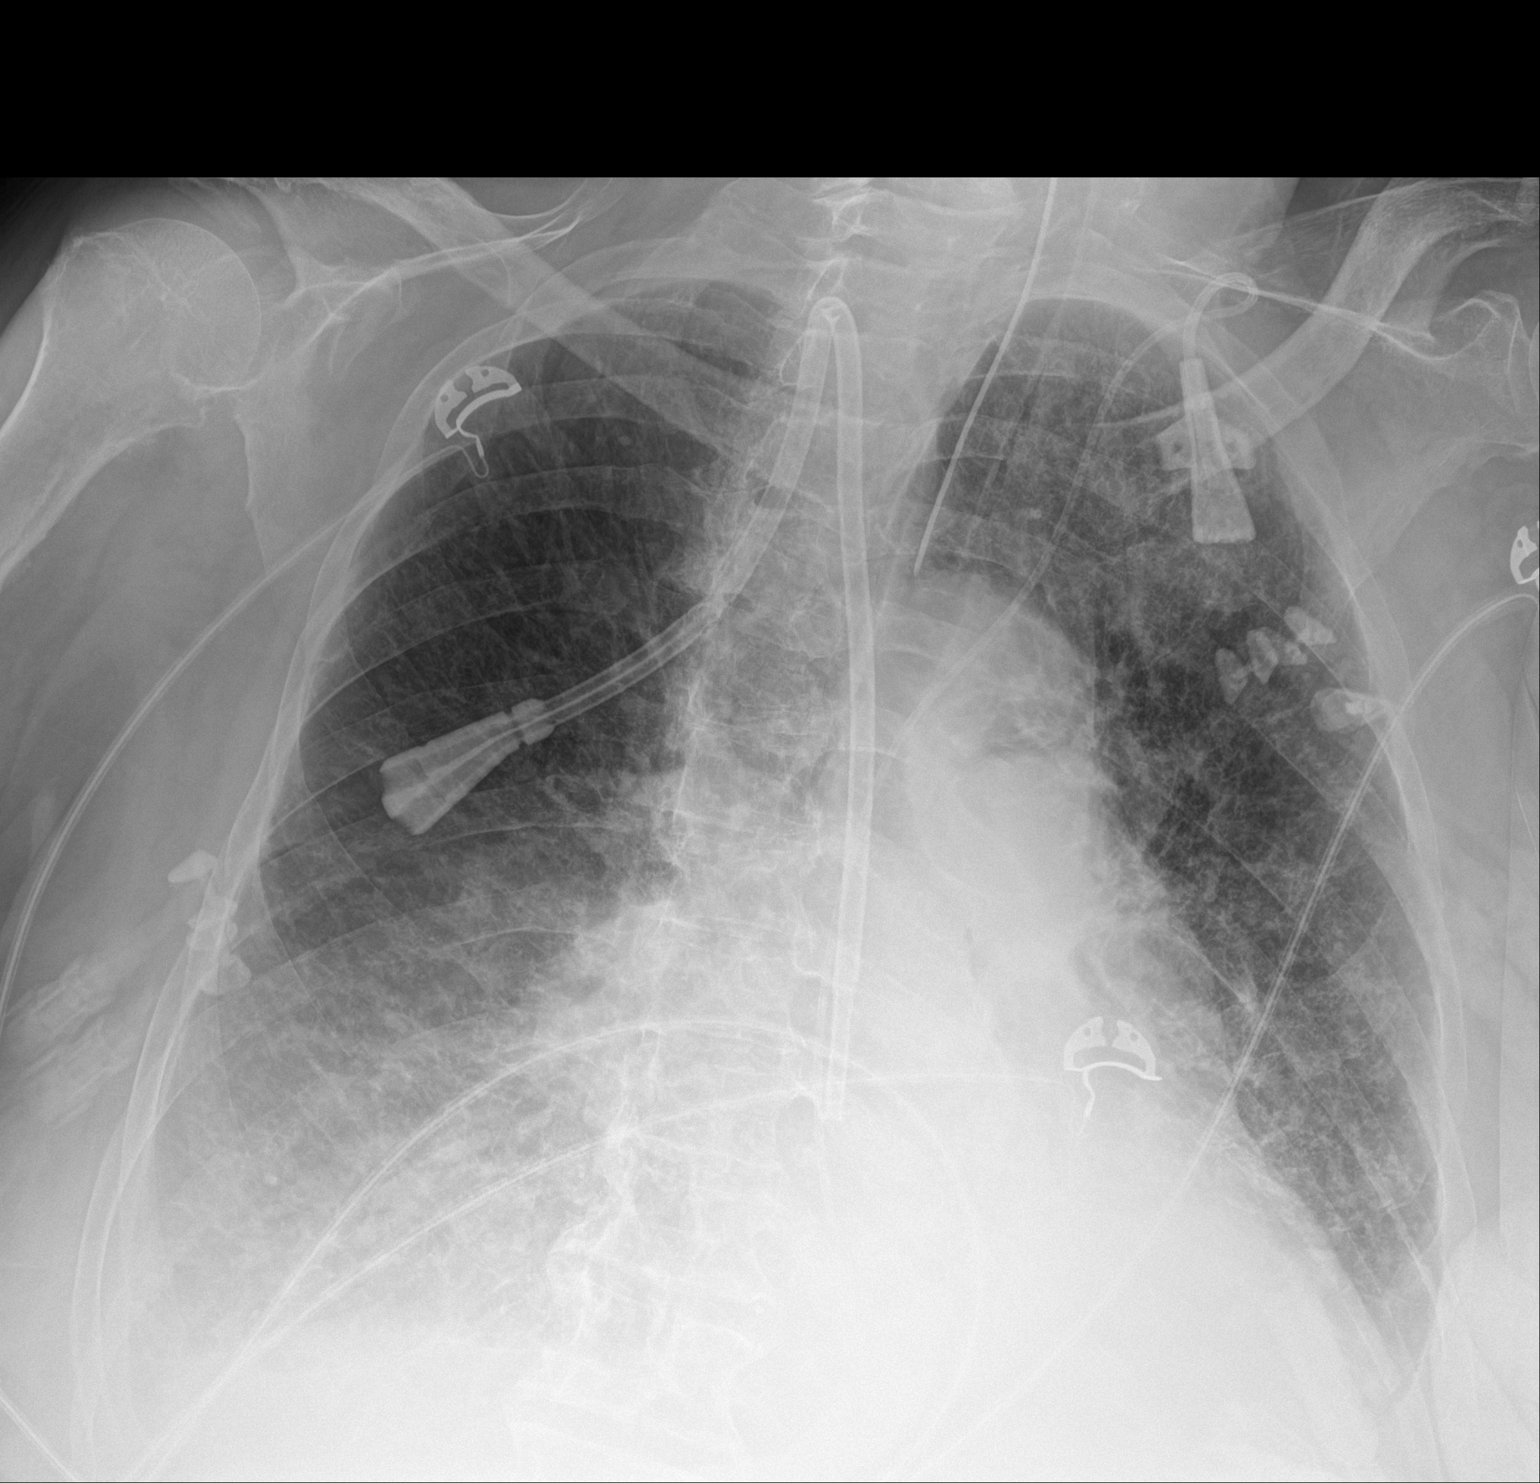

[1 of 1 positions shown; findings below may reference images not displayed]

FINDINGS: The patient's endotracheal tube is seen ending 5 cm above the
carina. A right-sided dual-lumen catheter is noted ending about the
distal SVC. A left IJ line is noted ending about the mid SVC.

There is mildly worsened vascular congestion. Bibasilar airspace
opacities raise concern for mildly worsening pulmonary edema. Small
bilateral pleural effusions are noted. No pneumothorax is seen.

The cardiomediastinal silhouette is normal in size. No acute osseous
abnormalities are identified.
IMPRESSION: 1. Endotracheal tube seen ending 5 cm above the carina.
2. Mildly worsened vascular congestion noted. Bibasilar airspace
opacities raise concern for mildly worsening pulmonary edema. Small
bilateral pleural effusions noted.

## 2019-09-16 IMAGING — DX DG CHEST 1V PORT
1 series · 1 of 1 positions shown · non-contrast
Comparison: 12/02/2017 and earlier.

CLINICAL DATA: 71-year-old female with respiratory failure,
ventilator dependent.

EXAM:
PORTABLE CHEST 1 VIEW

[chest ap]
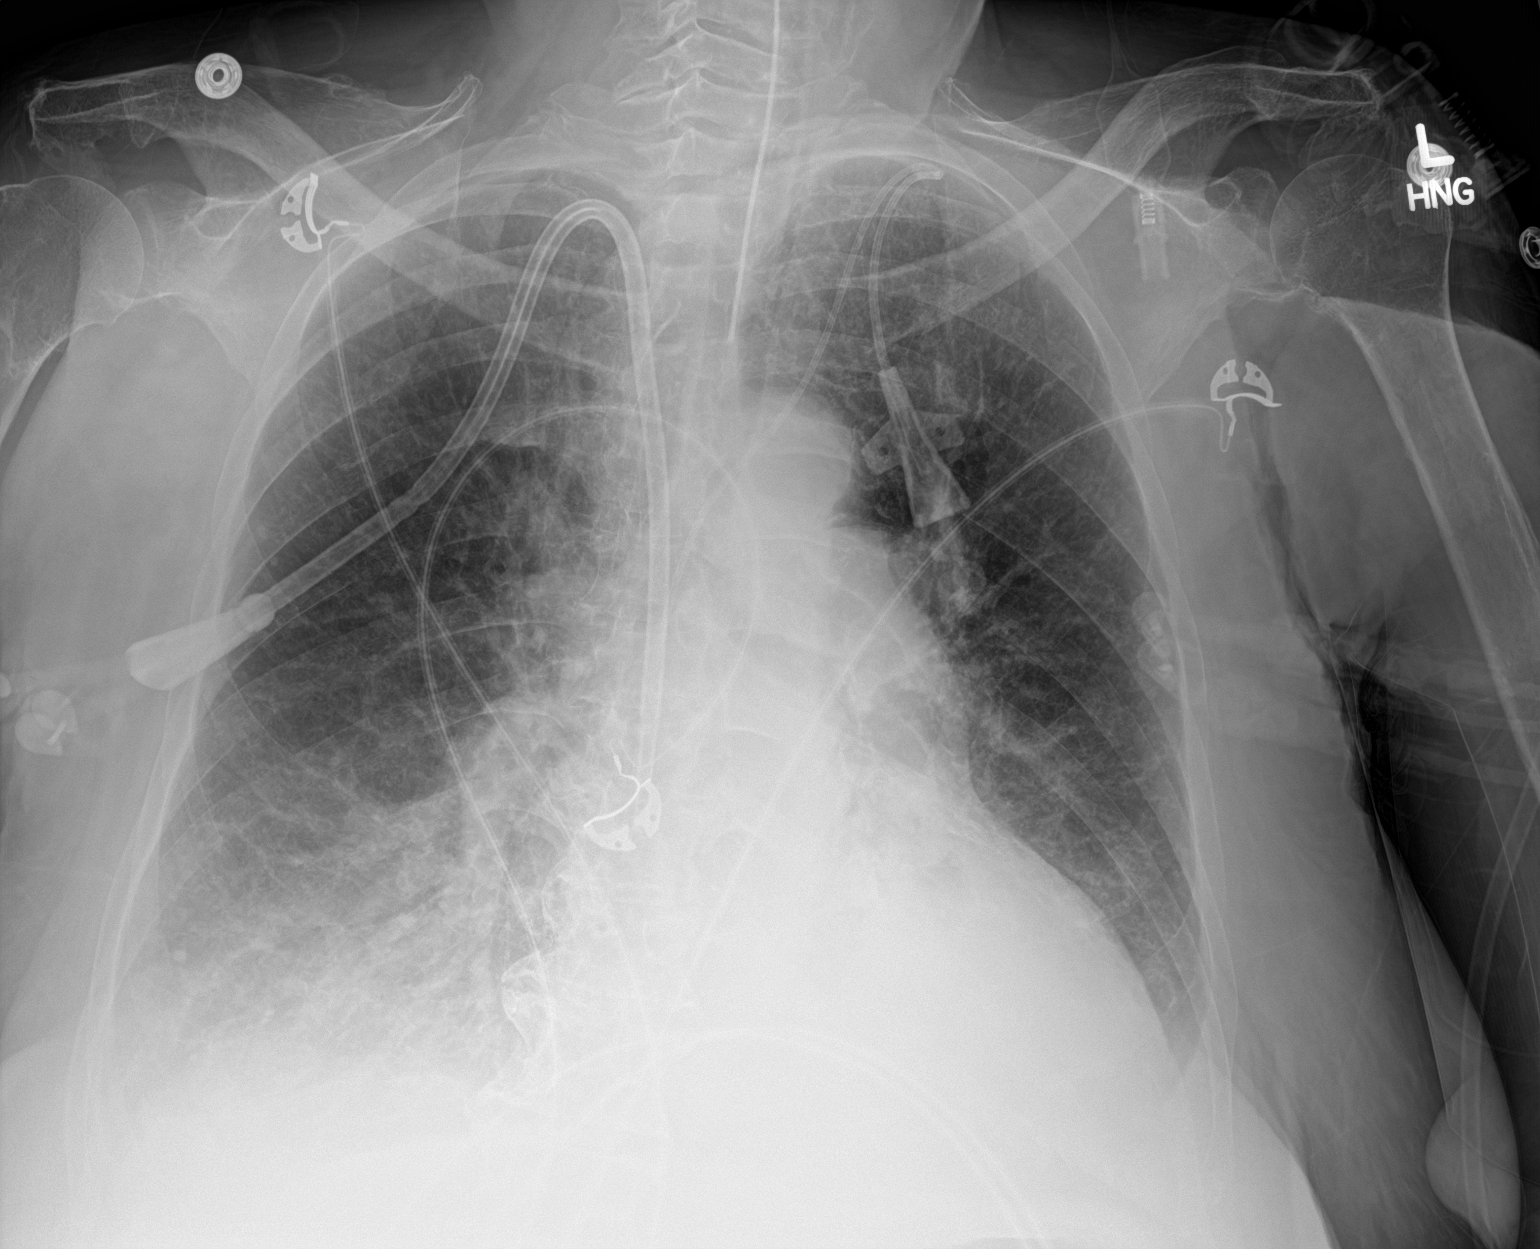

[1 of 1 positions shown; findings below may reference images not displayed]

FINDINGS: Portable AP view at 8922 hours. Endotracheal tube tip at the level
the clavicles. Stable right chest tube catheter and left IJ approach
single-lumen catheter.

Stable lung volumes. Stable cardiac size and mediastinal contours.
Continued confluent retrocardiac opacity on the left, patchy in
veiling opacity at the right lung base. No superimposed
pneumothorax. Pulmonary vascularity is stable since yesterday and
mildly decreased since 11/30/2017, no overt edema. No areas of
worsening ventilation.
IMPRESSION: 1. Endotracheal tube tip in satisfactory position at the level the
clavicles. Stable bilateral IJ venous catheters.
2. Decreased pulmonary vascular congestion since 11/30/2017 with
otherwise stable ventilation. Left lower lobe collapse or
consolidation, and probable small right pleural effusion.

## 2019-09-17 IMAGING — DX DG CHEST 1V PORT
1 series · 1 of 1 positions shown · non-contrast
Comparison: 12/03/2017

CLINICAL DATA: Intubated.

EXAM:
PORTABLE CHEST 1 VIEW

[chest ap]
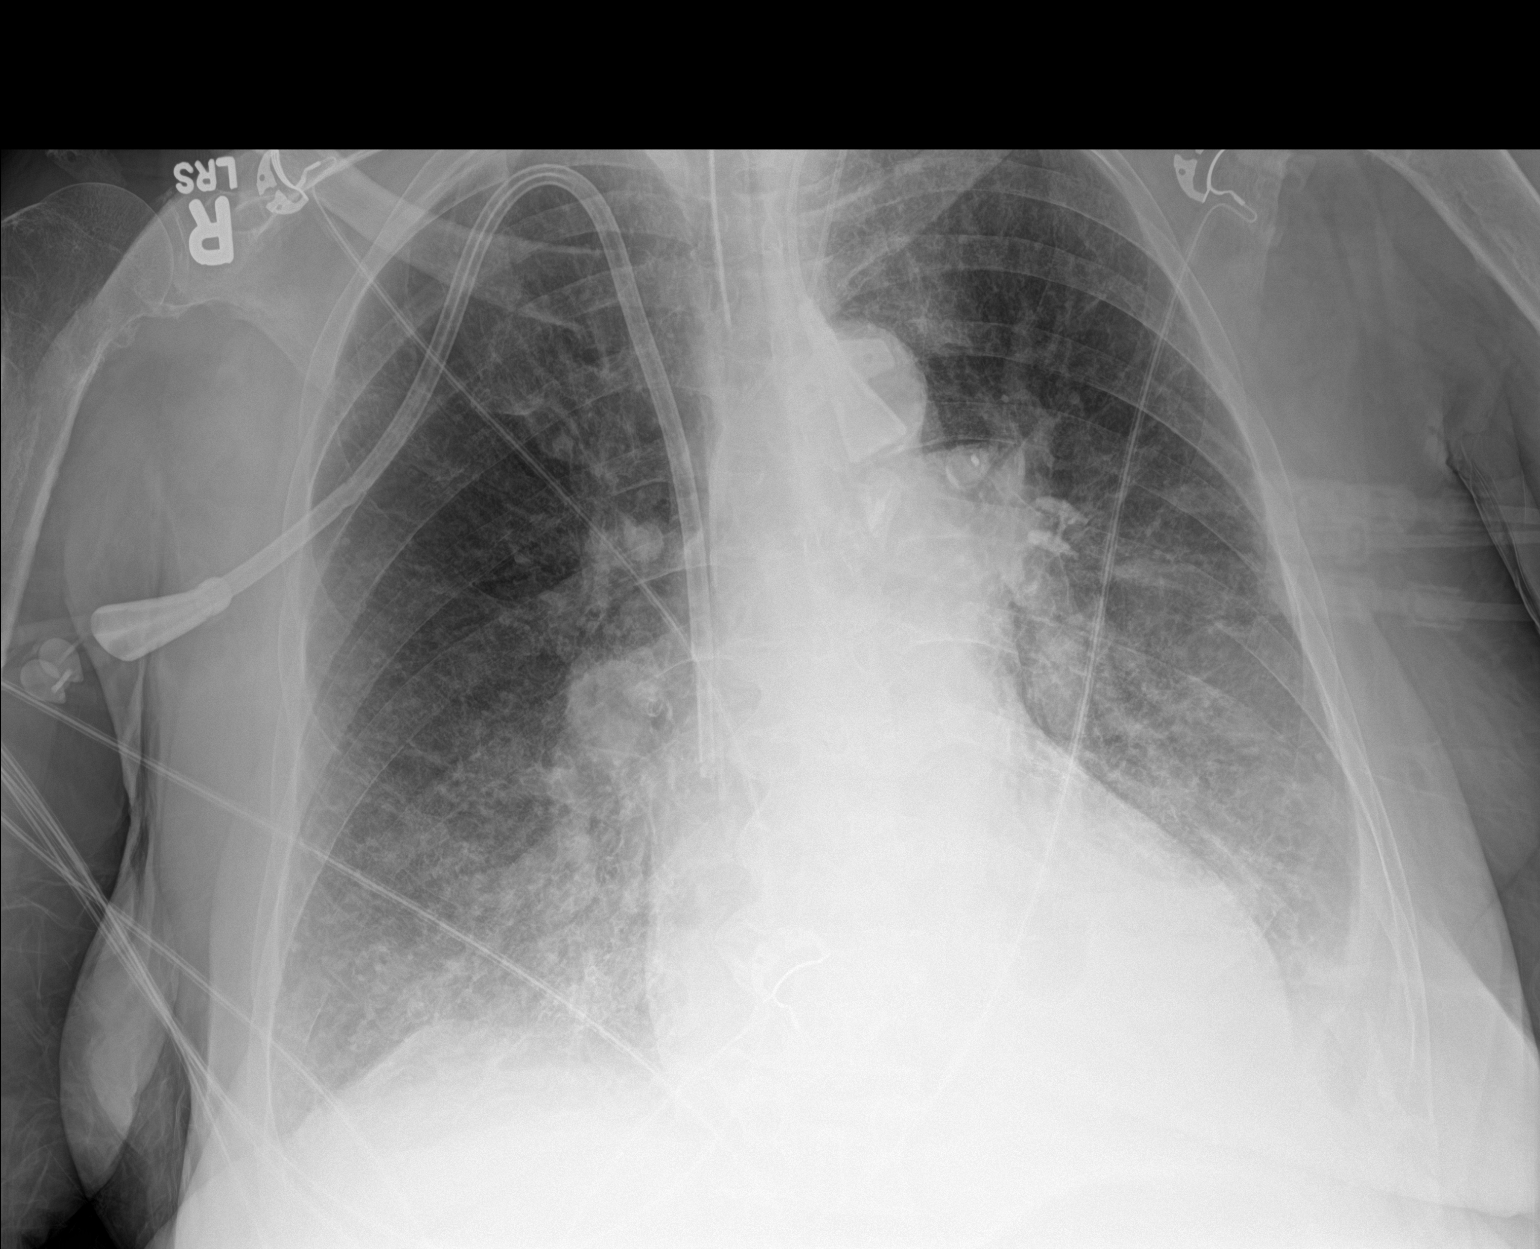

[1 of 1 positions shown; findings below may reference images not displayed]

FINDINGS: Endotracheal tube terminates at the clavicular heads, unchanged.
Bilateral jugular catheters remain in place in terminate over the
SVC. The cardiomediastinal silhouette is unchanged allowing for
differences in patient rotation. Patchy opacities throughout both
lung bases as well as denser retrocardiac consolidation in the left
lung base have not significantly changed. There is persistent mild
pulmonary vascular congestion and diffuse interstitial prominence.
There are likely small bilateral pleural effusions. No pneumothorax
is identified.
IMPRESSION: Similar appearance of pulmonary edema, pleural effusions, and left
lower lobe consolidation.

## 2019-09-19 IMAGING — DX DG CHEST 1V PORT
1 series · 1 of 1 positions shown · non-contrast
Comparison: Portable chest x-ray of 12/06/2017 and 12/05/2016

CLINICAL DATA: Respiratory failure, hemodialysis

EXAM:
PORTABLE CHEST 1 VIEW

[chest ap]
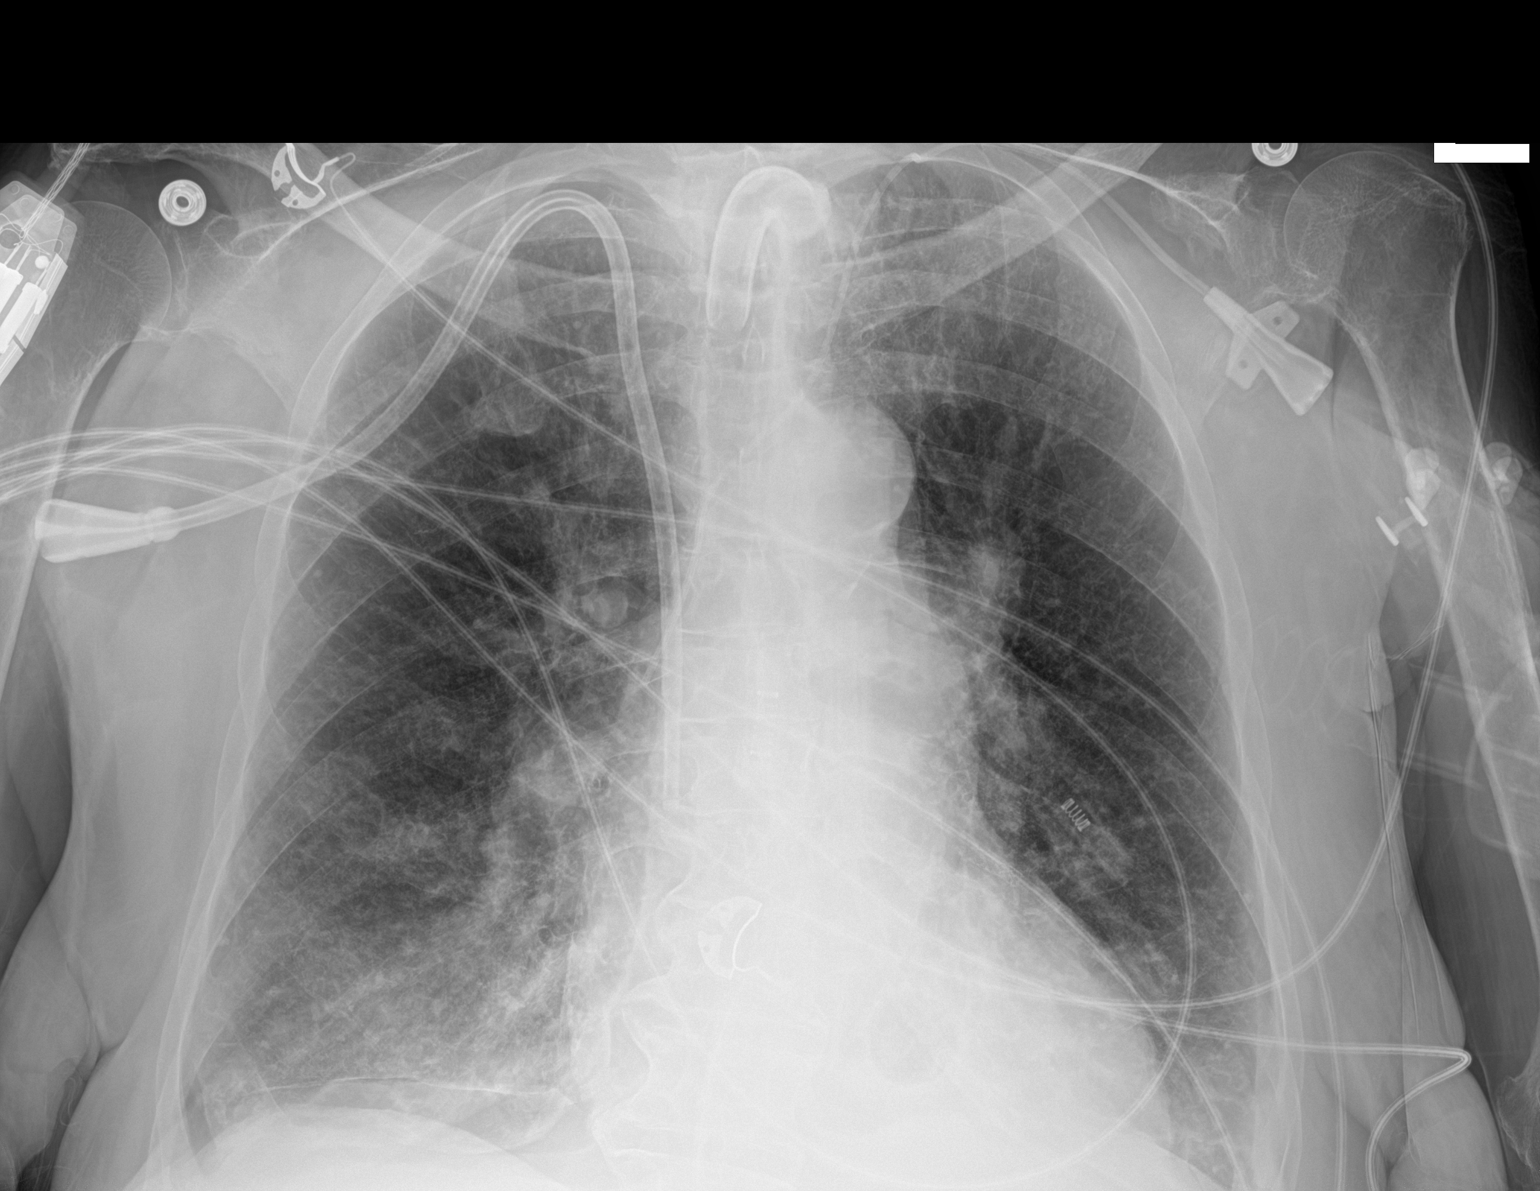

[1 of 1 positions shown; findings below may reference images not displayed]

FINDINGS: There is little change in the right basilar pneumothorax with a
small apical component on the right. Also there do appear to be
persistent small pleural effusions with mild bibasilar atelectasis.
The heart is within upper limits of normal. Right hemodialysis
catheter tip overlies the mid SVC and tracheostomy is noted. Left
PICC line tip also overlies the mid SVC.
IMPRESSION: 1. Little change in right basilar pneumothorax with small apical
component.
2. No significant change in small effusions with basilar
atelectasis.

## 2019-09-19 IMAGING — DX DG CHEST 1V PORT
1 series · 1 of 1 positions shown · non-contrast
Comparison: 12/05/2017

CLINICAL DATA: Tracheostomy

EXAM:
PORTABLE CHEST 1 VIEW

[chest ap]
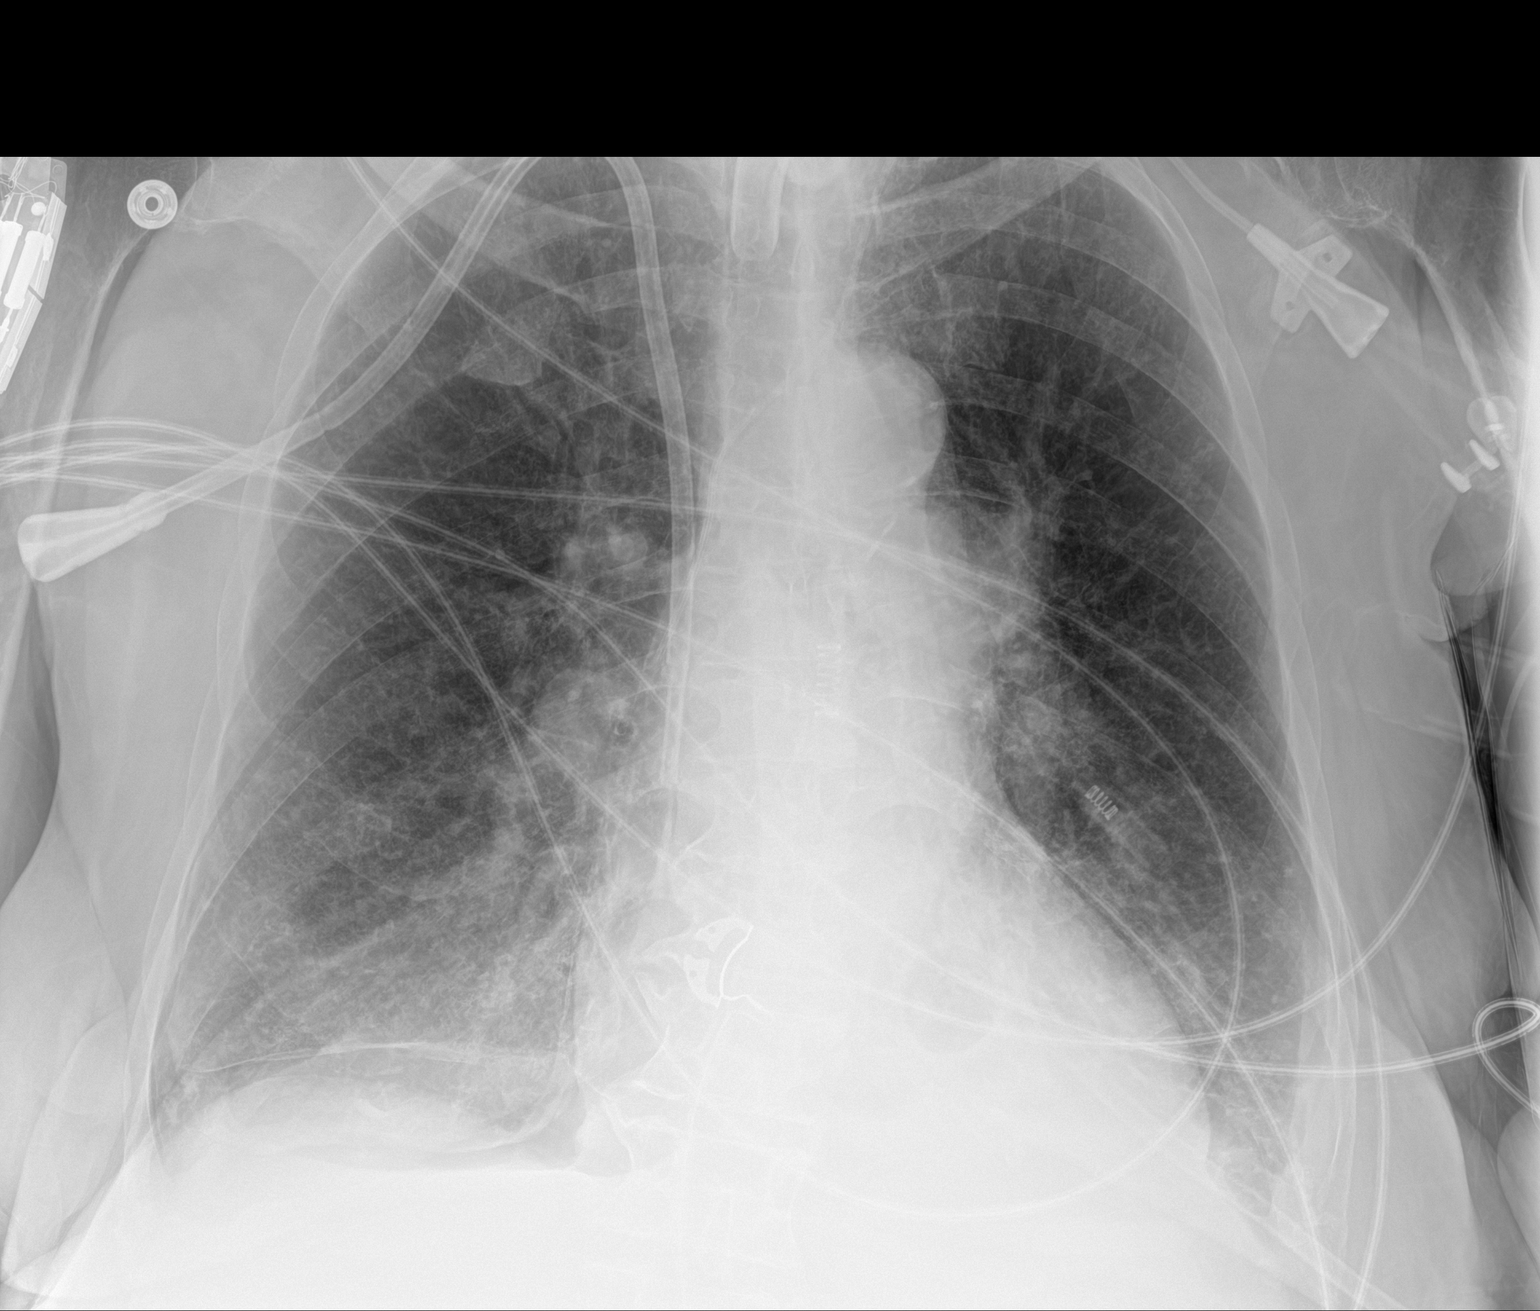

[1 of 1 positions shown; findings below may reference images not displayed]

FINDINGS: Tracheostomy and right dialysis catheter remain in place, unchanged.
There is a right basilar pneumothorax. This is new since prior study
and approximately 5-10% in size. Small bilateral effusions.
Bibasilar atelectasis. Heart is normal size.
IMPRESSION: New 5-10% right basilar pneumothorax.

Small effusions and bibasilar atelectasis.
